# Patient Record
Sex: Female | Born: 1960 | Race: Black or African American | Hispanic: No | Marital: Married | Smoking: Never smoker
Health system: Southern US, Community
[De-identification: ages and names within clinical notes are randomized; demographics above are authoritative.]

## PROBLEM LIST (undated history)

## (undated) DIAGNOSIS — M1990 Unspecified osteoarthritis, unspecified site: Secondary | ICD-10-CM

## (undated) DIAGNOSIS — J301 Allergic rhinitis due to pollen: Secondary | ICD-10-CM

## (undated) DIAGNOSIS — I10 Essential (primary) hypertension: Secondary | ICD-10-CM

## (undated) HISTORY — PX: FOOT SURGERY: SHX648

## (undated) HISTORY — PX: UTERINE FIBROID SURGERY: SHX826

## (undated) HISTORY — PX: OTHER SURGICAL HISTORY: SHX169

## (undated) HISTORY — DX: Essential (primary) hypertension: I10

## (undated) HISTORY — PX: POLYPECTOMY: SHX149

## (undated) HISTORY — DX: Allergic rhinitis due to pollen: J30.1

---

## 1999-06-22 ENCOUNTER — Other Ambulatory Visit: Admission: RE | Admit: 1999-06-22 | Discharge: 1999-06-22 | Payer: Self-pay | Admitting: *Deleted

## 2000-09-18 ENCOUNTER — Other Ambulatory Visit: Admission: RE | Admit: 2000-09-18 | Discharge: 2000-09-18 | Payer: Self-pay | Admitting: *Deleted

## 2001-05-21 ENCOUNTER — Other Ambulatory Visit: Admission: RE | Admit: 2001-05-21 | Discharge: 2001-05-21 | Payer: Self-pay | Admitting: *Deleted

## 2002-05-23 ENCOUNTER — Emergency Department (HOSPITAL_COMMUNITY): Admission: EM | Admit: 2002-05-23 | Discharge: 2002-05-23 | Payer: Self-pay | Admitting: Emergency Medicine

## 2002-05-24 ENCOUNTER — Ambulatory Visit: Admission: RE | Admit: 2002-05-24 | Discharge: 2002-05-24 | Payer: Self-pay | Admitting: Emergency Medicine

## 2002-05-24 ENCOUNTER — Encounter: Payer: Self-pay | Admitting: Emergency Medicine

## 2002-06-23 ENCOUNTER — Observation Stay (HOSPITAL_COMMUNITY): Admission: RE | Admit: 2002-06-23 | Discharge: 2002-06-24 | Payer: Self-pay | Admitting: Neurosurgery

## 2003-12-08 ENCOUNTER — Encounter: Admission: RE | Admit: 2003-12-08 | Discharge: 2003-12-08 | Payer: Self-pay | Admitting: Sports Medicine

## 2003-12-08 ENCOUNTER — Encounter (INDEPENDENT_AMBULATORY_CARE_PROVIDER_SITE_OTHER): Payer: Self-pay | Admitting: Sports Medicine

## 2004-03-23 ENCOUNTER — Encounter: Admission: RE | Admit: 2004-03-23 | Discharge: 2004-03-23 | Payer: Self-pay | Admitting: Family Medicine

## 2004-07-19 ENCOUNTER — Encounter: Admission: RE | Admit: 2004-07-19 | Discharge: 2004-07-19 | Payer: Self-pay | Admitting: Family Medicine

## 2004-09-21 ENCOUNTER — Ambulatory Visit: Payer: Self-pay | Admitting: Family Medicine

## 2004-09-23 ENCOUNTER — Encounter: Admission: RE | Admit: 2004-09-23 | Discharge: 2004-09-23 | Payer: Self-pay | Admitting: Sports Medicine

## 2005-07-18 ENCOUNTER — Ambulatory Visit: Payer: Self-pay | Admitting: Family Medicine

## 2005-07-31 ENCOUNTER — Encounter: Admission: RE | Admit: 2005-07-31 | Discharge: 2005-07-31 | Payer: Self-pay | Admitting: Sports Medicine

## 2005-08-04 ENCOUNTER — Encounter: Admission: RE | Admit: 2005-08-04 | Discharge: 2005-08-04 | Payer: Self-pay | Admitting: Sports Medicine

## 2006-09-27 ENCOUNTER — Ambulatory Visit: Payer: Self-pay | Admitting: Family Medicine

## 2006-11-17 ENCOUNTER — Encounter (INDEPENDENT_AMBULATORY_CARE_PROVIDER_SITE_OTHER): Payer: Self-pay | Admitting: *Deleted

## 2006-11-17 LAB — CONVERTED CEMR LAB

## 2006-11-19 ENCOUNTER — Encounter (INDEPENDENT_AMBULATORY_CARE_PROVIDER_SITE_OTHER): Payer: Self-pay | Admitting: Sports Medicine

## 2006-11-19 LAB — CONVERTED CEMR LAB: Pap Smear: NORMAL

## 2006-11-20 ENCOUNTER — Ambulatory Visit: Payer: Self-pay | Admitting: Sports Medicine

## 2007-02-15 ENCOUNTER — Encounter (INDEPENDENT_AMBULATORY_CARE_PROVIDER_SITE_OTHER): Payer: Self-pay | Admitting: Sports Medicine

## 2007-02-15 ENCOUNTER — Encounter (INDEPENDENT_AMBULATORY_CARE_PROVIDER_SITE_OTHER): Payer: Self-pay | Admitting: *Deleted

## 2007-02-26 ENCOUNTER — Ambulatory Visit (HOSPITAL_COMMUNITY): Admission: RE | Admit: 2007-02-26 | Discharge: 2007-02-26 | Payer: Self-pay | Admitting: Gynecology

## 2007-02-28 ENCOUNTER — Encounter (INDEPENDENT_AMBULATORY_CARE_PROVIDER_SITE_OTHER): Payer: Self-pay | Admitting: Sports Medicine

## 2007-02-28 DIAGNOSIS — D259 Leiomyoma of uterus, unspecified: Secondary | ICD-10-CM | POA: Insufficient documentation

## 2007-03-15 ENCOUNTER — Telehealth (INDEPENDENT_AMBULATORY_CARE_PROVIDER_SITE_OTHER): Payer: Self-pay | Admitting: *Deleted

## 2007-05-28 ENCOUNTER — Encounter: Admission: RE | Admit: 2007-05-28 | Discharge: 2007-05-28 | Payer: Self-pay | Admitting: Sports Medicine

## 2007-05-28 ENCOUNTER — Encounter (INDEPENDENT_AMBULATORY_CARE_PROVIDER_SITE_OTHER): Payer: Self-pay | Admitting: Sports Medicine

## 2007-05-31 ENCOUNTER — Encounter (INDEPENDENT_AMBULATORY_CARE_PROVIDER_SITE_OTHER): Payer: Self-pay | Admitting: Sports Medicine

## 2007-12-07 IMAGING — RF DG HYSTEROGRAM
5 series · 5 of 5 positions shown · non-contrast
Comparison: none

CLINICAL DATA: HYSTEROSALPINGOGRAM:
The procedure was performed by Dr. Lui.

[Series 1: run · 1 of 1 slices shown (1 of 5)]
[im 1/1]
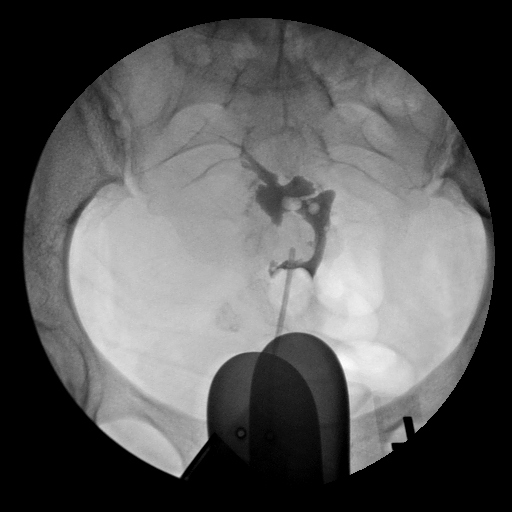

[Series 2: run · 1 of 1 slices shown (2 of 5)]
[im 1/1]
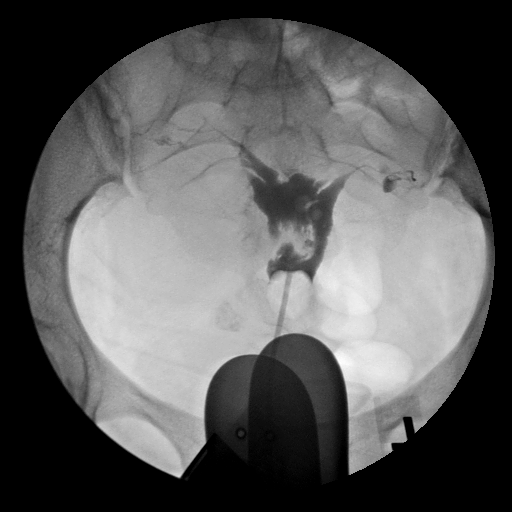

[Series 3: run · 1 of 1 slices shown (3 of 5)]
[im 1/1]
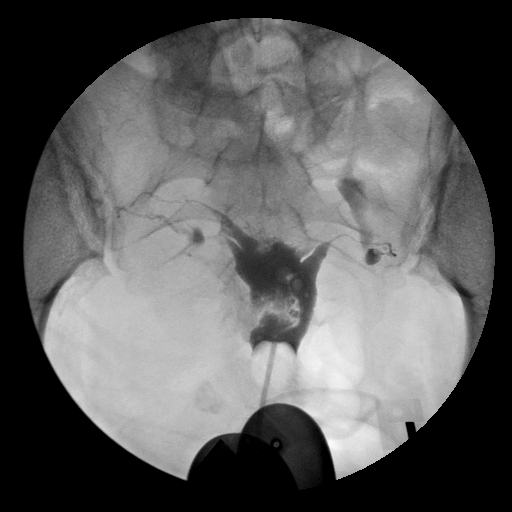

[Series 4: run · 1 of 1 slices shown (4 of 5)]
[im 1/1]
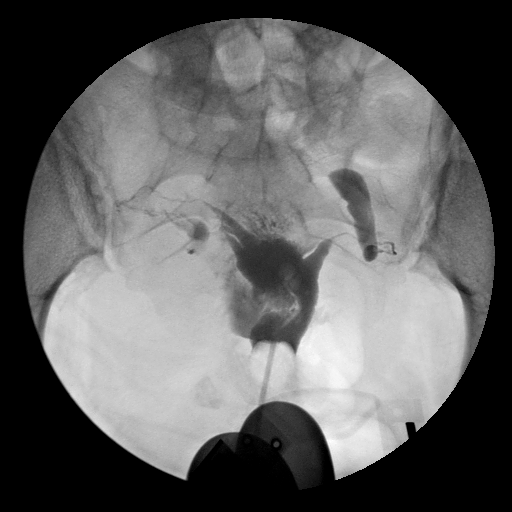

[Series 5: run · 1 of 1 slices shown (5 of 5)]
[im 1/1]
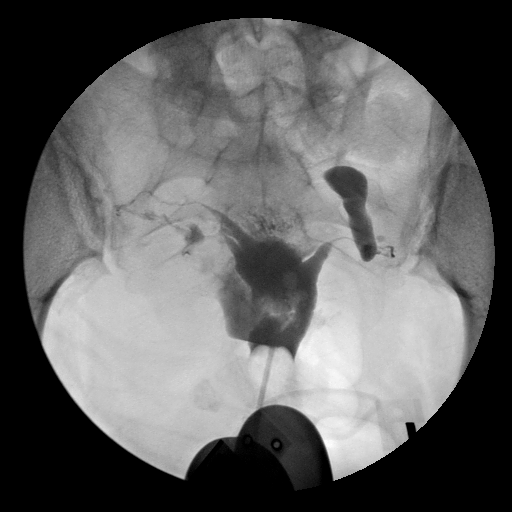

[5 of 5 positions shown; findings below may reference images not displayed]

FINDINGS: Five images are submitted for interpretation.  No free spill of contrast is seen into either adnexal region.  There is dilatation of the distal aspect of both fallopian tubes, left much more so that right, consistent with hydrosalpinx.  There is a round filling defect in the central uterine cavity originating from the right of midline, most consistent with a fibroid.  There are also linear filling defects at the fundus, likely related to synechia.
IMPRESSION: 1.  Blocked tubes  with hydrosalpinx bilaterally, left larger than  right.  
2.  Filling defect in the mid uterine cavity originating from the right uterine wall most consistent with a fibroid.  
3.  Fundal synechia.

## 2007-12-19 LAB — CONVERTED CEMR LAB: Pap Smear: NORMAL

## 2008-02-02 ENCOUNTER — Emergency Department: Payer: Self-pay | Admitting: Emergency Medicine

## 2008-04-15 ENCOUNTER — Ambulatory Visit: Payer: Self-pay | Admitting: Family Medicine

## 2008-04-15 DIAGNOSIS — R51 Headache: Secondary | ICD-10-CM | POA: Insufficient documentation

## 2008-04-15 DIAGNOSIS — M25569 Pain in unspecified knee: Secondary | ICD-10-CM | POA: Insufficient documentation

## 2008-04-15 DIAGNOSIS — R519 Headache, unspecified: Secondary | ICD-10-CM | POA: Insufficient documentation

## 2008-04-15 LAB — CONVERTED CEMR LAB
BUN: 11 mg/dL (ref 6–23)
CO2: 24 meq/L (ref 19–32)
Calcium: 9.3 mg/dL (ref 8.4–10.5)
Chloride: 105 meq/L (ref 96–112)
Cholesterol: 186 mg/dL (ref 0–200)
Creatinine, Ser: 0.5 mg/dL (ref 0.40–1.20)
Glucose, Bld: 100 mg/dL — ABNORMAL HIGH (ref 70–99)
HDL: 51 mg/dL (ref >39)
LDL Cholesterol: 119 mg/dL — ABNORMAL HIGH (ref 0–99)
Potassium: 4.4 meq/L (ref 3.5–5.3)
Sodium: 140 meq/L (ref 135–145)
Total CHOL/HDL Ratio: 3.6
Triglycerides: 82 mg/dL (ref <150)
VLDL: 16 mg/dL (ref 0–40)

## 2008-04-17 ENCOUNTER — Encounter: Payer: Self-pay | Admitting: Family Medicine

## 2008-06-22 ENCOUNTER — Telehealth: Payer: Self-pay | Admitting: *Deleted

## 2008-06-22 ENCOUNTER — Ambulatory Visit: Payer: Self-pay | Admitting: Family Medicine

## 2008-07-23 ENCOUNTER — Encounter: Admission: RE | Admit: 2008-07-23 | Discharge: 2008-07-23 | Payer: Self-pay | Admitting: Obstetrics and Gynecology

## 2008-10-13 ENCOUNTER — Telehealth: Payer: Self-pay | Admitting: *Deleted

## 2009-07-26 ENCOUNTER — Encounter: Admission: RE | Admit: 2009-07-26 | Discharge: 2009-07-26 | Payer: Self-pay | Admitting: Obstetrics and Gynecology

## 2009-08-04 ENCOUNTER — Encounter: Admission: RE | Admit: 2009-08-04 | Discharge: 2009-08-04 | Payer: Self-pay | Admitting: Obstetrics and Gynecology

## 2010-06-29 ENCOUNTER — Ambulatory Visit: Payer: Self-pay | Admitting: Family Medicine

## 2010-06-29 DIAGNOSIS — J309 Allergic rhinitis, unspecified: Secondary | ICD-10-CM | POA: Insufficient documentation

## 2010-07-03 ENCOUNTER — Emergency Department (HOSPITAL_COMMUNITY): Admission: EM | Admit: 2010-07-03 | Discharge: 2010-07-03 | Payer: Self-pay | Admitting: Family Medicine

## 2010-07-13 ENCOUNTER — Ambulatory Visit: Payer: Self-pay | Admitting: Family Medicine

## 2010-07-13 DIAGNOSIS — B079 Viral wart, unspecified: Secondary | ICD-10-CM | POA: Insufficient documentation

## 2010-07-27 ENCOUNTER — Encounter: Admission: RE | Admit: 2010-07-27 | Discharge: 2010-07-27 | Payer: Self-pay | Admitting: Obstetrics and Gynecology

## 2010-08-17 ENCOUNTER — Ambulatory Visit: Payer: Self-pay | Admitting: Family Medicine

## 2011-01-04 LAB — CBC
HCT: 37.8 % (ref 36.0–46.0)
Hemoglobin: 13.4 g/dL (ref 12.0–15.0)
MCH: 31.4 pg (ref 26.0–34.0)
MCHC: 35.4 g/dL (ref 30.0–36.0)
MCV: 88.5 fL (ref 78.0–100.0)
Platelets: 198 10*3/uL (ref 150–400)
RBC: 4.27 MIL/uL (ref 3.87–5.11)
RDW: 13.1 % (ref 11.5–15.5)
WBC: 5.8 10*3/uL (ref 4.0–10.5)

## 2011-01-04 LAB — SURGICAL PCR SCREEN
MRSA, PCR: NEGATIVE
Staphylococcus aureus: NEGATIVE

## 2011-01-10 ENCOUNTER — Inpatient Hospital Stay (HOSPITAL_COMMUNITY)
Admission: RE | Admit: 2011-01-10 | Discharge: 2011-01-12 | Payer: Self-pay | Source: Home / Self Care | Attending: Obstetrics and Gynecology | Admitting: Obstetrics and Gynecology

## 2011-01-10 ENCOUNTER — Encounter (INDEPENDENT_AMBULATORY_CARE_PROVIDER_SITE_OTHER): Payer: Self-pay | Admitting: Obstetrics and Gynecology

## 2011-01-11 LAB — CBC
HCT: 26.6 % — ABNORMAL LOW (ref 36.0–46.0)
Hemoglobin: 9.2 g/dL — ABNORMAL LOW (ref 12.0–15.0)
MCH: 31.7 pg (ref 26.0–34.0)
MCHC: 34.6 g/dL (ref 30.0–36.0)
MCV: 91.7 fL (ref 78.0–100.0)
Platelets: 107 10*3/uL — ABNORMAL LOW (ref 150–400)
RBC: 2.9 MIL/uL — ABNORMAL LOW (ref 3.87–5.11)
RDW: 13.3 % (ref 11.5–15.5)
WBC: 8.2 10*3/uL (ref 4.0–10.5)

## 2011-01-11 LAB — PREGNANCY, URINE: Preg Test, Ur: NEGATIVE

## 2011-01-12 LAB — CBC
HCT: 23.9 % — ABNORMAL LOW (ref 36.0–46.0)
Hemoglobin: 8.2 g/dL — ABNORMAL LOW (ref 12.0–15.0)
MCH: 31.3 pg (ref 26.0–34.0)
MCHC: 34.3 g/dL (ref 30.0–36.0)
MCV: 91.2 fL (ref 78.0–100.0)
Platelets: 108 10*3/uL — ABNORMAL LOW (ref 150–400)
RBC: 2.62 MIL/uL — ABNORMAL LOW (ref 3.87–5.11)
RDW: 13.3 % (ref 11.5–15.5)
WBC: 8 10*3/uL (ref 4.0–10.5)

## 2011-01-17 NOTE — Assessment & Plan Note (Signed)
Summary: freeze wart on leg,tcb   Vital Signs:  Patient profile:   50 year old female Height:      67.5 inches Weight:      203 pounds BMI:     31.44 BSA:     2.05 Temp:     98.1 degrees F Pulse rate:   76 / minute BP sitting:   160 / 94  Vitals Entered By: Jone Baseman CMA (July 13, 2010 4:36 PM) CC: freeze wart on legs Is Patient Diabetic? No Pain Assessment Patient in pain? no        CC:  freeze wart on legs.  History of Present Illness: Pt here for re-freeze of wart on right leg.  Seen about 2 weeks ago and had wart froozen on her right leg for the first time.  reports some scabbing over of the wart, but feels that it is generally improved from last visit.  No abnormal reactions, tolerated last procedure well. Would like repeat freezing today.   Habits & Providers  Alcohol-Tobacco-Diet     Tobacco Status: never  Problems Prior to Update: 1)  Allergic Rhinitis  (ICD-477.9) 2)  Elevated Blood Pressure Without Diagnosis of Hypertension  (ICD-796.2) 3)  Patello-femoral Syndrome  (ICD-719.46) 4)  Headache  (ICD-784.0) 5)  Well Adult Exam  (ICD-V70.0) 6)  Fibroids, Uterus  (ICD-218.9)  Allergies (verified): No Known Drug Allergies  Physical Exam  General:  VS reviewed, elevated BPalert, well-developed, well-nourished, and well-hydrated.   Msk:  normal ROM, no joint tenderness, and no joint swelling.   Extremities:  No clubbing, cyanosis, edema Skin:  right leg: below knee scabbed wart about the size of a quarter noted   Impression & Recommendations:  Problem # 1:  WART, RIGHT KNEE (ICD-078.10) Assessment Improved  Re-frooze today.  Pt tolerated well, may need 1 more treatment.  F/u in 3-4 weeks for another tx and BP check per Dr. Jennette Kettle    Orders: [facility]- Est Level  3 (81829)  Complete Medication List: 1)  Fluticasone Propionate 50 Mcg/act Susp (Fluticasone propionate) .Marland Kitchen.. 1-2 puffs each nostril once daily  Patient Instructions: 1)  Nice to meet  you! 2)  Please continue taking your blood pressure as you and Dr. Jennette Kettle discussed at your last visit.  Write them down and RTC in 3-4 weeks.  At that visit Dr. Jennette Kettle will check your leg and see if you need any further treatment.  She will also discuss your blood pressure with you.

## 2011-01-17 NOTE — Assessment & Plan Note (Signed)
Summary: CPE,tcb   Vital Signs:  Patient profile:   50 year old female Height:      67.5 inches Weight:      205.0 pounds BMI:     31.75 Temp:     98.7 degrees F oral Pulse rate:   76 / minute BP sitting:   152 / 90  (left arm) Cuff size:   large  Vitals Entered By: Gladstone Pih (June 29, 2010 10:50 AM) CC: CPE Is Patient Diabetic? No Pain Assessment Patient in pain? no      Comments pap done at GYN office   CC:  CPE.  History of Present Illness: CPE has had some cold type symptoms and allergy symptoms last few weeks. No fever. Some sore throat that seems to be intermittent Mild nasal stuffiness. No fatigue fever sweats or chills  2) right upper back pain taht is mild but aggravating--notices after extended sitting working on computer. self resolves. 2/10. no radaiation.  3) Bump on back she is concerned with  4) GYN takes care of her pap smear and breast exam--has appt in next few months. Not sure when her mammo is due--thinks she  Habits & Providers  Alcohol-Tobacco-Diet     Tobacco Status: never  Problems Prior to Update: 1)  Patello-femoral Syndrome  (ICD-719.46) 2)  Headache  (ICD-784.0) 3)  Well Adult Exam  (ICD-V70.0) 4)  Fibroids, Uterus  (ICD-218.9)  Current Medications (verified): 1)  Fluticasone Propionate 50 Mcg/act Susp (Fluticasone Propionate) .Marland Kitchen.. 1-2 Puffs Each Nostril Once Daily  Review of Systems  The patient denies anorexia, fever, weight loss, weight gain, chest pain, syncope, dyspnea on exertion, peripheral edema, prolonged cough, headaches, abdominal pain, severe indigestion/heartburn, difficulty walking, depression, and abnormal bleeding.    Physical Exam  General:  alert, well-developed, well-nourished, and well-hydrated.   Eyes:  pupils equal, pupils round, and pupils reactive to light.   Ears:  R ear normal and L ear normal.   Nose:  no external deformity.   nasal mucosa is boggy and swollen, erythematous, no d/c Mouth:  good  dentition.   Neck:  supple, full ROM, no masses, and no thyromegaly.   Breasts:  deferred to GYN Lungs:  normal respiratory effort and normal breath sounds.   Heart:  normal rate, regular rhythm, and no murmur.   Abdomen:  soft, non-tender, normal bowel sounds, no distention, and no masses.   Genitalia:  deferred to GYN Msk:  normal ROM, no joint tenderness, no joint swelling, and no joint warmth.   Mild TTP right rhombioid muscle area Extremities:  No clubbing, cyanosis, edema, or deformity noted with normal full range of motion of all joints.   Neurologic:  alert & oriented X3, gait normal, and DTRs symmetrical and normal.   Skin:  no worrisome skin lesions on complete skin exam. Lots of vevi on back.One area of blocked pore on back with no sign of ifection Cervical Nodes:  No lymphadenopathy noted Axillary Nodes:  No palpable lymphadenopathy Psych:  Oriented X3, memory intact for recent and remote, normally interactive, good eye contact, not anxious appearing, and not depressed appearing.     Impression & Recommendations:  Problem # 1:  WELL ADULT EXAM (ICD-V70.0)  Orders: [facility] - Est  40-64 yrs (16109) she will check on her mammogram exercising discussed calcium intake  Problem # 2:  ELEVATED BLOOD PRESSURE WITHOUT DIAGNOSIS OF HYPERTENSION (ICD-796.2) could be related to some otc cold medicine she is taking gave her BP card to fill out  and return with some random readings  Problem # 3:  ALLERGIC RHINITIS (ICD-477.9)  Her updated medication list for this problem includes:    Fluticasone Propionate 50 Mcg/act Susp (Fluticasone propionate) .Marland Kitchen... 1-2 puffs each nostril once daily will restart her on nasal steroids--has used before  Complete Medication List: 1)  Fluticasone Propionate 50 Mcg/act Susp (Fluticasone propionate) .Marland Kitchen.. 1-2 puffs each nostril once daily  Patient Instructions: 1)  Please check on your mammogram and see if it is due. 2)  Please record a few blood  pressures and mail the card back to me. 3)  Come back in 2-3 weeks and we can do a second freeze of the wart on your leg. 4)  Great to see you! Prescriptions: FLUTICASONE PROPIONATE 50 MCG/ACT SUSP (FLUTICASONE PROPIONATE) 1-2 puffs each nostril once daily  #1 x 12   Entered and Authorized by:   Denny Levy MD   Signed by:   Denny Levy MD on 06/29/2010   Method used:   Print then Give to Patient   RxID:   [id number]

## 2011-01-17 NOTE — Assessment & Plan Note (Signed)
Summary: f/u last visit/eo   Vital Signs:  Patient profile:   50 year old female Weight:      203.8 pounds Temp:     98.7 degrees F oral Pulse rate:   71 / minute Pulse rhythm:   regular BP sitting:   153 / 88  (left arm) Cuff size:   large  Vitals Entered By: Loralee Pacas CMA (August 17, 2010 2:02 PM)  History of Present Illness: 1) f/u elevated BP Readings at home high 140s with occasional 15, bottom in 88 range. No symptoms.  2)Has been very stressed--her mother has some dementia--is living by herself---Chloeanne very involved inher care andstruggling with decisions. Fortunately lives 9 minutes away from her [place]. Mom diabetic, wheel chair bound, has dog for companion. 3) warty area onknee improved--still remaining wart. No problems with last freezing.  Current Medications (verified): 1)  Fluticasone Propionate 50 Mcg/act Susp (Fluticasone Propionate) .Marland Kitchen.. 1-2 Puffs Each Nostril Once Daily 2)  Triamterene-Hctz 37.5-25 Mg Tabs (Triamterene-Hctz) .Marland Kitchen.. 1 By Mouth Qd  Allergies (verified): No Known Drug Allergies  Review of Systems  The patient denies anorexia, fever, weight loss, chest pain, syncope, dyspnea on exertion, and peripheral edema.    Physical Exam  General:  alert, well-developed, well-nourished, and well-hydrated.   Neck:  supple and full ROM.   Lungs:  normal respiratory effort.   Heart:  normal rate, regular rhythm, and no murmur.   Skin:  right knee still has warty area but 50% reduction in wart mass from initial. Actually 5 separate small warts now. some mild redness where scab from last freeze has comeoff--no sign onfection Psych:  Oriented X3, memory intact for recent and remote, normally interactive, good eye contact, not anxious appearing, and not depressed appearing.     Impression & Recommendations:  Problem # 1:  HYPERTENSION (ICD-401.9)  multiple readings in 145-155 range with diastolics in 88 range. Will start dyazide. tale some interval readings  and rtc 1 m Orders: [facility]- Est  Level 4 (99214) check bmp next ov  Problem # 2:  WART, RIGHT KNEE (ICD-078.10)  we will re-freeze today andprobably (hopefully ) final treatment next ov 1 m Orders: [facility]- Est  Level 4 (99214) Cryo (2nd - 14th) - [facility] (17003)  Problem # 3:  FAMILY STRESS (ICD-V61.9)  long discussion--spending 50% 40 minute ov in counseling and education re issues of dealing with older parent. Support Orders: [facility]- Est  Level 4 (47829)  Complete Medication List: 1)  Fluticasone Propionate 50 Mcg/act Susp (Fluticasone propionate) .Marland Kitchen.. 1-2 puffs each nostril once daily 2)  Triamterene-hctz 37.5-25 Mg Tabs (Triamterene-hctz) .Marland Kitchen.. 1 by mouth qd [facility]- Est  Level 4 (99214) Cryo (2nd - 14th) - [facility] (56213) Prescriptions: TRIAMTERENE-HCTZ 37.5-25 MG TABS (TRIAMTERENE-HCTZ) 1 by mouth qd  #30 x 12   Entered and Authorized by:   Denny Levy MD   Signed by:   Denny Levy MD on 08/17/2010   Method used:   Electronically to        [facility]  [address] (retail)       [address]       [place], [place]  [address]       Ph: [phone] or [phone]       Fax: [phone]   RxID:   [id number]    Impression & Recommendations:  Her updated medication list for this problem includes:    Triamterene-hctz 37.5-25 Mg Tabs (Triamterene-hctz) .Marland Kitchen... 1 by mouth qd    Orders: [facility]- Est  Level 4 (99214)       Orders: [facility]- Est  Level 4 (99214) Cryo (2nd - 14th) - [facility] (17003)       Orders: [facility]- Est  Level 4 (04540)     Complete Medication List: 1)  Fluticasone Propionate 50 Mcg/act Susp (Fluticasone propionate) .Marland Kitchen.. 1-2 puffs each nostril once daily 2)  Triamterene-hctz 37.5-25 Mg Tabs (Triamterene-hctz) .Marland Kitchen.. 1 by mouth qd   Prevention & Chronic Care Immunizations   Influenza vaccine: Not documented    Tetanus booster: 04/15/2008: given   Tetanus booster due: 04/15/2018    Pneumococcal vaccine: Not documented  Other Screening   Pap smear: normal   (12/19/2007)   Pap smear due: 12/18/2008    Mammogram: ASSESSMENT: Negative - BI-RADS 1^MM DIGITAL SCREENING  (07/27/2010)   Mammogram due: 05/2008   Smoking status: never  (07/13/2010)  Lipids   Total Cholesterol: 186  (04/15/2008)   LDL: 119  (04/15/2008)   LDL Direct: Not documented   HDL: 51  (04/15/2008)   Triglycerides: 82  (04/15/2008)  Hypertension   Last Blood Pressure: 153 / 88  (08/17/2010)   Serum creatinine: 0.50  (04/15/2008)   Serum potassium 4.4  (04/15/2008)    Hypertension flowsheet reviewed?: Yes   Progress toward BP goal: Unchanged  Self-Management Support :   Personal Goals (by the next clinic visit) :      Personal blood pressure goal: 140/90  (08/17/2010)   Hypertension self-management support: Not documented

## 2011-01-25 NOTE — Discharge Summary (Signed)
  Courtney Sheppard, Courtney Sheppard                ACCOUNT NO.:  [id number]  MEDICAL RECORD NO.:  [id number]          PATIENT TYPE:  INP  LOCATION:  9320                          [facility]ILITY:  [facility]  PHYSICIAN:  Maxie Better, M.D.DATE OF BIRTH:  11-05-1961  DATE OF ADMISSION:  01/10/2011 DATE OF DISCHARGE:  01/12/2011                              DISCHARGE SUMMARY   ADMISSION DIAGNOSES: 1. Fibroid uterus/enlarging uterine fibroids. 2. Menorrhagia.  DISCHARGE DIAGNOSES: 1. Fibroid uterus. 2. Bilateral hydrosalpinx. 3. Pelvic adhesions. 4. Anemia secondary to acute blood loss.  PROCEDURES: 1. Exploratory laparotomy. 2. Myomectomy. 3. Distal bilateral tuboplasty. 4. Lysis of adhesions.  HISTORY OF PRESENT ILLNESS:  A 50 year old G0, married black female with long history of infertility, fibroid uterus which is increasing in size with increased cycles, who is now being admitted for surgical management.  HOSPITAL COURSE:  The patient was admitted to [facility].  She was taken to the operating room where she underwent a removal of 415 g fibroid by pathology report.  The findings at the time of surgery is that of bilateral hydrosalpinges and some pelvic  adhesions of the right fallopian tube and ovary to the uterus posteriorly.  These adhesions were lysed and the distal end of both tubes were opened and tuboplasty performed along with Myomectomy.  Please see the dictated operative report for specific details.  Her postoperative course was notable for a slow bowel function which was subsequently resolved by postop day #2 with a passage of flatus.  Her CBC on postop day #1 showed a hemoglobin of 9.28, white count 8.2, hematocrit 26.6, and platelet count of 107,000 which will be repeated before the patient is discharged.  The patient remained afebrile throughout hospital course.  Her incision was notable for ecchymosis on the incision, particularly on the right side, extending slightly  about an inch above that incision and on the mons pubis.  Probing of the incision did not reveal any  fluid.  The patient had no pad.  Abdomen had active bowel sounds.  She had no evidence of infection.  She was deemed well to be discharged home.  DISPOSITION:  Home.  CONDITION:  Stable.  DISCHARGE MEDICATIONS: 1. Tylox 1-2 tablets every 3-4 hours p.r.n. pain. 2. Motrin 800 mg every 6-8 hours p.r.n. pain. 3. Niferex Forte 1 p.o. daily.  FOLLOWUP APPOINTMENT:  In [facility] for staple removal on January 17, 2011, and 6 weeks postop.  DISCHARGE INSTRUCTIONS:  Reviewed with the patient including but not limited to nothing per vagina for 4-6 weeks, no heavy lifting or driving for 2 weeks.  Call for increased incisional pain, drainage or redness in the incision site, severe abdominal pain, nausea, vomiting, no intercourse, or tampons.     Maxie Better, M.D.     [place]/MEDQ  D:  01/12/2011  T:  01/13/2011  Job:  [id number]  Electronically Signed by Nena Jordan Dwon Sky M.D. on 01/25/2011 07:07:47 AM

## 2011-01-25 NOTE — Op Note (Signed)
[name], Courtney Sheppard                ACCOUNT NO.:  [id number]  MEDICAL RECORD NO.:  [id number]          PATIENT TYPE:  INP  LOCATION:  9320                          [facility]ACILITY:  [facility]  PHYSICIAN:  Maxie Better, M.D.DATE OF BIRTH:  November 25, 1961  DATE OF PROCEDURE:  01/10/2011 DATE OF DISCHARGE:                              OPERATIVE REPORT   PREOPERATIVE DIAGNOSES: 1. Enlarging uterine fibroids. 2. Infertility. 3. Menorrhagia.  PROCEDURES:  Exploratory laparotomy, myomectomy, distal bilateral tuboplasty, lysis of adhesions.  POSTOPERATIVE DIAGNOSES: 1. Enlarging uterine fibroids. 2. Menorrhagia. 3. Bilateral hydrosalpinx. 4. Pelvic adhesions.  ANESTHESIA:  General.  SURGEON:  Maxie Better, MD  ASSISTANT:  Genia Del, MD  PROCEDURE:  Under adequate general anesthesia, the patient was placed in the dorsal lithotomy position.  Examination under anesthesia revealed a uterus at about 18-week size, mobile.  The patient was sterilely prepped and draped in usual fashion.  Indwelling Foley catheter was sterilely placed.  Bivalve speculum was placed in the vagina and acorn cannula was introduced into the cervical os and attached to the tenaculum for manipulation of the uterus.  The cervix was then grasped with a single- tooth tenaculum and attached to the acorn cannula methylene blue. Chromopertubation apparatus was then attached to the acorn cannula. Attention was then turned to the abdomen.  A 0.25% Marcaine was injected along the planned Pfannenstiel skin incision site.  Pfannenstiel skin incision was then made carried down to the rectus fascia.  Rectus fascia was opened transversely.  The rectus fascia was then bluntly and sharply dissected off the rectus muscle in the superior and inferior fashion. The rectus muscles split in midline.  The parietal peritoneum was entered sharply and extended.  Upper abdomen was noted to have normal palpable liver and palpable  kidneys.  The uterus was enlarged, mobile with evidence of the left tube and  its distal 2/3 dilated  with dark fluid with absence of any evidence of fimbriae, consistent with left hydrosalpinx, similar finding was noted on the right tube except the fluid was clear on the right and the tube was attached to uterus and the right ovary.  The left ovary was normal.   The right ovary had some adhesions to the lateral aspect of the uterus.   The posterior cul-de-sac had small adhesion which was able to be removed.  An attempt to exteriorize the uterus on several occasions was unsuccessful due to the location of the fibroids.  Decision was then made to place the self- retaining Balfour retractor.  The palpation of fibroid anteriorly resulted in decision to place dilute solution of Pitressin in a vertical fashion followed by incision overlying that fibroid and the incision was carried down to the level of the fibroid capsule.  At that point, the fibroid was grasped with single-tooth tenaculum with traction resulting in the deliver that fibroid.  The uterus was then exteriorized, resulting in evidence of the large 12 cm fibroid that was noted. It had been described as posterior in location on sonogram.  At that point, more dilute  solution of Pitressin was injected and incision on the uterus was extended anteriorly and  the fibroid was enucleated with sharp and blunt dissection from its base.  At that point, the chromopertubation was performed.  No evidence of blue dye was noted in either of the tubes or the cavity  despite about 240 mL of fluid insufflated. Palpation throughout the uterus did not reveal any other fibroids. The uterus still appear to be enlarged which may suggest adenomyosis.  Once there was no evidence of any other additional fibroids, the defect from where the fibroid came was closed with interrupted 0 Vicryl figure-of- eight sutures all the way to the serosal surface at which time  3-0 Monocryl suture was then utilized to close the serosa in its baseball fashion.  Good hemostasis noted at that point.  Attention was then turned to tubes and ovary.  On the right, the ovary was lysed off of this attachment to the uterus posteriorly.  The left hydrosalpinx was brought up and was opened like a plus sign fashion using cautery and then using a 5-0 Vicryl, the tips of the stellate incision was then everted circumferentially and leaving the tube open. Nonmalodorous fluid was released. The same procedure was performed on the right tube with again opening of the distal end of the fallopian tube in a plus sign/stellate fashion and followed by using 5-0 Vicryl and attaching each flap circumferentially around to keep the tube open. Good hemostasis was then noted.  The abdomen was then copiously irrigated and suctioned.  The self-retaining retractor was removed.  The uterus was then returned to the abdomen.  Good hemostasis was noted along the uterine incision and Interceed was then placed overlying that incision.  The parietal peritoneum was then closed with 2-0 Vicryl.  The rectus fascia was closed with 0 Vicryl x2.  The subcutaneous area was irrigated, small bleeders cauterized, interrupted 2-0 plain sutures placed and the skin approximated with Ethicon staples.  The instruments from the vagina was also removed.  Specimen was a large fibroid weighing 420g sent to Pathology.  Estimated blood loss was 250 mL.  Intraoperative fluid 1100 mL.  Urine output 400 mL clear yellow urine.  Sponge and instrument counts x2 was correct.  Complications was none.  The patient tolerated the procedure well, was transferred to recovery in stable condition.     Maxie Better, M.D.     [place]/MEDQ  D:  01/10/2011  T:  01/11/2011  Job:  [id number]  Electronically Signed by Nena Jordan Ayvin Lipinski M.D. on 01/25/2011 07:48:45 AM

## 2011-01-26 ENCOUNTER — Encounter: Payer: Self-pay | Admitting: *Deleted

## 2011-05-05 NOTE — Op Note (Signed)
. [facility]  Patient:    Courtney Sheppard, Courtney Sheppard Visit Number: [id number] MRN: [id number]          Service Type: SUR Location: [id number] Attending Physician:  Cristi Loron Dictated by:   Cristi Loron, M.D. Proc. Date: 06/23/02 Admit Date:  06/23/2002 Discharge Date: 06/24/2002                             Operative Report  PREOPERATIVE DIAGNOSES:  Left C6-7 herniated nucleus pulposus, spinal stenosis, cervical radiculopathy, cervicalgia.  POSTOPERATIVE DIAGNOSES:  Left C6-7 herniated nucleus pulposus, spinal stenosis, cervical radiculopathy, cervicalgia.  PROCEDURE:  C6-7 extensive anterior cervical diskectomy, interbody iliac crest allograft arthrodesis, anterior cervical plating (Synthes titanium plate and screws).  SURGEON:  Cristi Loron, M.D.  ASSISTANT:  Tanya Nones. Jeral Fruit, M.D.  ANESTHESIA:  General endotracheal.  ESTIMATED BLOOD LOSS:  150 cc.  SPECIMENS:  None.  DRAINS:  None.  COMPLICATIONS:  None.  BRIEF HISTORY:  The patient is a 50 year old black female who has suffered from neck and left arm pain.  She failed medical management and was worked up with a cervical MRI, which demonstrated a herniated disk at C6-7 on the left. The patients signs, symptoms, and physical exam were consistent with a C7 radiculopathy.  I discussed the various treatment options with her, including surgery.  The patient weighed the risks, benefits, and alternatives of surgery and decided to proceed with the operation.  DESCRIPTION OF PROCEDURE:  The patient was brought to the operating room by the anesthesia team.  General endotracheal anesthesia was induced.  The patient remained in the supine position.  A roll was placed under her shoulders to place her neck in slight extension.  Her anterior cervical region was then prepared with Betadine scrub and Betadine solution.  Sterile drapes were applied.  I then injected the area to be  incised with Marcaine with epinephrine solution and used a scalpel to make a transverse incision in the patients left anterior neck.  I used the Metzenbaum scissors to divide the platysma muscle and then to dissect medial to the sternocleidomastoid muscle, jugular vein, and carotid artery.  I bluntly dissected down toward the anterior cervical spine and carefully identified the esophagus and retracted it medially.  I cleared the soft tissue from the anterior cervical spine using Kitner swabs and then inserted a bent spinal needle into the upper exposed interspace.  I obtained the intraoperative radiograph to confirm our location. I then used electrocautery to detach the medial border of the longus colli muscle bilaterally from the C6-7 intervertebral disk space and then I inserted the Caspar self-retaining retractor for exposure.  I then incised the C6-7 intervertebral disk and performed a partial diskectomy using the pituitary forceps and the Karlin curettes.  I inserted the distraction screws at C6 and C7, distracted the interspace.  I then used the high-speed drill to decorticate the vertebral end plates at Y7-8 and drill away the remainder of the C6-7 intervertebral disk.  I thinned out the posterior longitudinal ligament with the drill, incised it with the arachnoid, and removed it with the Kerrison punch, undercutting the vertebral end plates at G9-5 and decompressing the thecal sac.  I then performed a foraminotomy about the bilateral C7 nerve roots, and there was a huge herniated disk on the left compressing the left C7 nerve root.  I performed a generous foraminotomy about both nerve roots and decompressed  them both well.  I now turned my attention to arthrodesis.  I obtained the iliac crest tricortical allograft bone graft and fashioned it to these approximate dimensions: 7 mm in height, 1 cm in depth.  I inserted the bone graft into the intervertebral disk space and Dr. Jeral Fruit  tapped it into place.  I then released and removed the distraction screws.  There was a good, snug fit of the bone graft.  We now turned our attention to the anterior spinal instrumentation.  We obtained the appropriate length Synthes plate, laid it along the anterior aspect of the vertebral bodies at C6 and C7, drilled two holes at C6, two at C7, tapped the holes, and then secured the plate to the vertebral bodies with two 14 mm screws at each level.  We then obtained the intraoperative radiograph.  It demonstrated adequate position of the upper screws.  The lower screws could not be seen because of the patients body habitus but looked good in vivo.  We then secured the screws to the plate and secured the locking screw at each screw.  The wound was then copiously irrigated with bacitracin solution, the solution was removed.  We obtained stringent hemostasis with bipolar electrocautery and Gelfoam.  I then removed the Caspar self-retaining retractor and inspected the esophagus for any damage, and there was none apparent.  We then reapproximated the patients platysma muscle with interrupted 3-0 Vicryl suture, subcutaneous tissue with interrupted 3-0 Vicryl suture, and the skin with Steri-Strips and benzoin.  The wound was then coated with bacitracin ointment and sterile dressings applied.  The drapes were removed.  The patient was subsequently extubated by the anesthesia team and transported to the postanesthesia care unit in stable condition.  All sponge, instrument, and needle counts were correct at the end of the case. Dictated by:   Cristi Loron, M.D. Attending Physician:  Tressie Stalker D DD:  06/23/02 TD:  06/26/02 Job: [id number]

## 2011-06-26 ENCOUNTER — Other Ambulatory Visit: Payer: Self-pay | Admitting: Obstetrics and Gynecology

## 2011-07-06 ENCOUNTER — Other Ambulatory Visit: Payer: Self-pay | Admitting: Obstetrics and Gynecology

## 2011-07-06 DIAGNOSIS — N63 Unspecified lump in unspecified breast: Secondary | ICD-10-CM

## 2011-07-12 ENCOUNTER — Other Ambulatory Visit: Payer: Self-pay

## 2011-07-18 ENCOUNTER — Ambulatory Visit
Admission: RE | Admit: 2011-07-18 | Discharge: 2011-07-18 | Disposition: A | Discharge status: Home / Self Care | Payer: BC Managed Care – PPO | Source: Ambulatory Visit | Attending: Obstetrics and Gynecology | Admitting: Obstetrics and Gynecology

## 2011-07-18 ENCOUNTER — Other Ambulatory Visit: Payer: Self-pay | Admitting: Obstetrics and Gynecology

## 2011-07-18 DIAGNOSIS — N63 Unspecified lump in unspecified breast: Secondary | ICD-10-CM

## 2012-03-19 ENCOUNTER — Other Ambulatory Visit: Payer: Self-pay | Admitting: Obstetrics and Gynecology

## 2012-03-19 DIAGNOSIS — N6009 Solitary cyst of unspecified breast: Secondary | ICD-10-CM

## 2012-03-22 ENCOUNTER — Ambulatory Visit
Admission: RE | Admit: 2012-03-22 | Discharge: 2012-03-22 | Disposition: A | Discharge status: Home / Self Care | Payer: BC Managed Care – PPO | Source: Ambulatory Visit | Attending: Obstetrics and Gynecology | Admitting: Obstetrics and Gynecology

## 2012-03-22 DIAGNOSIS — N6009 Solitary cyst of unspecified breast: Secondary | ICD-10-CM

## 2012-06-21 ENCOUNTER — Other Ambulatory Visit: Payer: Self-pay | Admitting: Obstetrics and Gynecology

## 2012-06-21 DIAGNOSIS — Z1231 Encounter for screening mammogram for malignant neoplasm of breast: Secondary | ICD-10-CM

## 2012-07-18 ENCOUNTER — Ambulatory Visit
Admission: RE | Admit: 2012-07-18 | Discharge: 2012-07-18 | Disposition: A | Discharge status: Home / Self Care | Payer: BC Managed Care – PPO | Source: Ambulatory Visit | Attending: Obstetrics and Gynecology | Admitting: Obstetrics and Gynecology

## 2012-07-18 DIAGNOSIS — Z1231 Encounter for screening mammogram for malignant neoplasm of breast: Secondary | ICD-10-CM

## 2012-12-18 HISTORY — PX: BREAST BIOPSY: SHX20

## 2013-01-20 ENCOUNTER — Ambulatory Visit (INDEPENDENT_AMBULATORY_CARE_PROVIDER_SITE_OTHER): Payer: BC Managed Care – PPO | Admitting: Surgery

## 2013-01-20 ENCOUNTER — Encounter (INDEPENDENT_AMBULATORY_CARE_PROVIDER_SITE_OTHER): Payer: Self-pay | Admitting: Surgery

## 2013-01-20 VITALS — BP 150/90 | HR 78 | Temp 97.8°F | Resp 20 | Ht 67.0 in | Wt 208.0 lb

## 2013-01-20 DIAGNOSIS — L723 Sebaceous cyst: Secondary | ICD-10-CM

## 2013-01-20 NOTE — Progress Notes (Signed)
General Surgery [facility]  Chief Complaint  Patient presents with  . New Evaluation    eval abscess on back    HISTORY: Patient is a 52 year old black female who presents with a symptomatic sebaceous cyst in the left mid back. This has been present for some time and slowly enlarging. Over the past week she has developed pain. She was seen by her primary care physician and started on oral antibiotics. She presents today for evaluation for surgical excision.  No past medical history on file.   Current Outpatient Prescriptions  Medication Sig Dispense Refill  . cephALEXin (KEFLEX) 500 MG capsule Take 500 mg by mouth 3 (three) times daily.      . fluticasone (FLONASE) 50 MCG/ACT nasal spray 1-2 sprays daily. To each nostril       . triamterene-hydrochlorothiazide (MAXZIDE-25) 37.5-25 MG per tablet Take 1 tablet by mouth daily.          Allergies  Allergen Reactions  . Aspirin      No family history on file.   History   Social History  . Marital Status: Married    Spouse Name: N/A    Number of Children: N/A  . Years of Education: N/A   Social History Main Topics  . Smoking status: Never Smoker   . Smokeless tobacco: None  . Alcohol Use: No  . Drug Use: No  . Sexually Active:    Other Topics Concern  . None   Social History Narrative  . None     REVIEW OF SYSTEMS - PERTINENT POSITIVES ONLY: No drainage from the mass. No previous cyst excised in the past.  EXAM: Filed Vitals:   01/20/13 1601  BP: 150/90  Pulse: 78  Temp: 97.8 F (36.6 C)  Resp: 20    HEENT: normocephalic; pupils equal and reactive; sclerae clear; dentition good; mucous membranes moist NECK:  symmetric on extension; no palpable anterior or posterior cervical lymphadenopathy; no supraclavicular masses; no tenderness CHEST: clear to auscultation bilaterally without rales, rhonchi, or wheezes CARDIAC: regular rate and rhythm without significant murmur; peripheral  pulses are full BACK:  In the mid back to the left of midline over the paraspinous muscle is a 3 cm soft tissue mass with a small sinus tract to the skin; no fluctuance; mild tenderness; no drainage EXT:  non-tender without edema; no deformity NEURO: no gross focal deficits; no sign of tremor   LABORATORY RESULTS: See [facility] (CHL-Epic) for most recent results   RADIOLOGY RESULTS: See [facility] (CHL-Epic) for most recent results   IMPRESSION: Sebaceous cyst, 3 cm, left mid back, symptomatic  PLAN: Patient is on oral antibiotics from her primary care provider. She would like to have this excised definitively. This can be done as an outpatient procedure under sedation. We will make arrangements for outpatient surgery in the near future at a time convenient for the patient.  The risks and benefits of the procedure have been discussed at length with the patient.  The patient understands the proposed procedure, potential alternative treatments, and the course of recovery to be expected.  All of the patient's questions have been answered at this time.  The patient wishes to proceed with surgery.  Velora Heckler, MD, FACS General & Endocrine Surgery [facility]   Visit Diagnoses: 1. Sebaceous cyst, mid back     Primary Care Physician: Emeterio Reeve, MD

## 2013-01-20 NOTE — Patient Instructions (Signed)

## 2013-03-25 ENCOUNTER — Other Ambulatory Visit: Payer: Self-pay

## 2013-04-30 ENCOUNTER — Other Ambulatory Visit: Payer: Self-pay | Admitting: Obstetrics and Gynecology

## 2013-04-30 DIAGNOSIS — N6320 Unspecified lump in the left breast, unspecified quadrant: Secondary | ICD-10-CM

## 2013-05-09 ENCOUNTER — Other Ambulatory Visit: Payer: BC Managed Care – PPO

## 2013-05-20 ENCOUNTER — Other Ambulatory Visit: Payer: BC Managed Care – PPO

## 2013-06-02 ENCOUNTER — Ambulatory Visit
Admission: RE | Admit: 2013-06-02 | Discharge: 2013-06-02 | Disposition: A | Discharge status: Home / Self Care | Payer: BC Managed Care – PPO | Source: Ambulatory Visit | Attending: Obstetrics and Gynecology | Admitting: Obstetrics and Gynecology

## 2013-06-02 DIAGNOSIS — N6320 Unspecified lump in the left breast, unspecified quadrant: Secondary | ICD-10-CM

## 2013-07-18 LAB — HM MAMMOGRAPHY

## 2013-08-20 ENCOUNTER — Emergency Department (INDEPENDENT_AMBULATORY_CARE_PROVIDER_SITE_OTHER)
Admission: EM | Admit: 2013-08-20 | Discharge: 2013-08-20 | Disposition: A | Discharge status: Home / Self Care | Payer: BC Managed Care – PPO | Source: Home / Self Care

## 2013-08-20 ENCOUNTER — Encounter (HOSPITAL_COMMUNITY): Payer: Self-pay | Admitting: *Deleted

## 2013-08-20 DIAGNOSIS — J302 Other seasonal allergic rhinitis: Secondary | ICD-10-CM

## 2013-08-20 DIAGNOSIS — J309 Allergic rhinitis, unspecified: Secondary | ICD-10-CM

## 2013-08-20 MED ORDER — HYDROCOD POLST-CHLORPHEN POLST 10-8 MG/5ML PO LQCR: 5.0000 mL | Freq: Two times a day (BID) | ORAL | Status: DC | PRN

## 2013-08-20 MED ORDER — FEXOFENADINE HCL 180 MG PO TABS: 180.0000 mg | ORAL_TABLET | Freq: Every day | ORAL | Status: DC

## 2013-08-20 MED ORDER — FLUTICASONE PROPIONATE 50 MCG/ACT NA SUSP: 1.0000 | Freq: Two times a day (BID) | NASAL | Status: DC

## 2013-08-20 NOTE — ED Notes (Signed)
C/o cough since last Sunday.   No sore throat, earache or fever.  Occasional runny nose.  Headache when she coughs.

## 2013-08-20 NOTE — ED Provider Notes (Signed)
CSN: [id number]     Arrival date & time 08/20/13  1843 History   None    Chief Complaint  Patient presents with  . Cough   (Consider location/radiation/quality/duration/timing/severity/associated sxs/prior Treatment) Patient is a 52 y.o. female presenting with cough. The history is provided by the patient.  Cough Cough characteristics:  Non-productive and dry Severity:  Moderate Onset quality:  Sudden Duration:  10 days Progression:  Unchanged Chronicity:  New Smoker: no   Context: weather changes   Associated symptoms: rhinorrhea and sinus congestion   Associated symptoms: no chills, no fever, no headaches and no shortness of breath     History reviewed. No pertinent past medical history. Past Surgical History  Procedure Laterality Date  . Uterine fibroid surgery    . Foot surgery     Family History  Problem Relation Age of Onset  . Hypertension Mother   . Diabetes Mother   . Stroke Mother   . Heart failure Father    History  Substance Use Topics  . Smoking status: Never Smoker   . Smokeless tobacco: Not on file  . Alcohol Use: No   OB History   Grav Para Term Preterm Abortions TAB SAB Ect Mult Living                 Review of Systems  Constitutional: Negative.  Negative for fever and chills.  HENT: Positive for congestion, rhinorrhea and postnasal drip.   Respiratory: Positive for cough. Negative for shortness of breath.   Cardiovascular: Negative.   Gastrointestinal: Negative.   Neurological: Negative for headaches.    Allergies  Aspirin  Home Medications   Current Outpatient Rx  Name  Route  Sig  Dispense  Refill  . fluticasone (FLONASE) 50 MCG/ACT nasal spray      1-2 sprays daily. To each nostril          . cephALEXin (KEFLEX) 500 MG capsule   Oral   Take 500 mg by mouth 3 (three) times daily.         . chlorpheniramine-HYDROcodone (TUSSIONEX PENNKINETIC ER) 10-8 MG/5ML LQCR   Oral   Take 5 mLs by mouth every 12 (twelve) hours as  needed. For cough   115 mL   1   . fexofenadine (ALLEGRA) 180 MG tablet   Oral   Take 1 tablet (180 mg total) by mouth daily.   30 tablet   1   . fluticasone (FLONASE) 50 MCG/ACT nasal spray   Nasal   Place 1 spray into the nose 2 (two) times daily.   1 g   2   . triamterene-hydrochlorothiazide (MAXZIDE-25) 37.5-25 MG per tablet   Oral   Take 1 tablet by mouth daily.           BP 159/60  Pulse 76  Temp(Src) 98.6 F (37 C) (Oral)  Resp 17  SpO2 97%  LMP 08/15/2013 Physical Exam  Nursing note and vitals reviewed. Constitutional: She is oriented to person, place, and time. She appears well-developed and well-nourished.  HENT:  Head: Normocephalic.  Right Ear: External ear normal.  Left Ear: External ear normal.  Mouth/Throat: Oropharynx is clear and moist.  Eyes: Conjunctivae are normal. Pupils are equal, round, and reactive to light.  Neck: Normal range of motion. Neck supple.  Cardiovascular: Normal rate, regular rhythm, normal heart sounds and intact distal pulses.   Pulmonary/Chest: Effort normal and breath sounds normal.  Lymphadenopathy:    She has no cervical adenopathy.  Neurological: She is  alert and oriented to person, place, and time.  Skin: Skin is warm and dry.    ED Course  Procedures (including critical care time) Labs Review Labs Reviewed - No data to display Imaging Review No results found.  MDM   1. Seasonal allergic rhinitis        Linna Hoff, MD 08/20/13 2047

## 2013-08-27 NOTE — ED Notes (Signed)
Call from patient , asking why she is still having syx ;advised we will be delighted to recheck her if continued problems, or she can go to her regular MD for continued issed

## 2014-07-28 LAB — HM PAP SMEAR

## 2014-07-29 ENCOUNTER — Encounter: Payer: Self-pay | Admitting: Adult Health

## 2014-07-29 ENCOUNTER — Ambulatory Visit (INDEPENDENT_AMBULATORY_CARE_PROVIDER_SITE_OTHER): Payer: BC Managed Care – PPO | Admitting: Adult Health

## 2014-07-29 VITALS — BP 152/90 | HR 72 | Temp 98.1°F | Resp 14 | Ht 68.0 in | Wt 205.5 lb

## 2014-07-29 DIAGNOSIS — R030 Elevated blood-pressure reading, without diagnosis of hypertension: Secondary | ICD-10-CM

## 2014-07-29 DIAGNOSIS — I10 Essential (primary) hypertension: Secondary | ICD-10-CM

## 2014-07-29 DIAGNOSIS — Z23 Encounter for immunization: Secondary | ICD-10-CM

## 2014-07-29 DIAGNOSIS — Z139 Encounter for screening, unspecified: Secondary | ICD-10-CM

## 2014-07-29 DIAGNOSIS — Z13220 Encounter for screening for lipoid disorders: Secondary | ICD-10-CM

## 2014-07-29 LAB — LIPID PANEL
CHOLESTEROL: 193 mg/dL (ref 0–200)
HDL: 48 mg/dL (ref >39.00)
LDL Cholesterol: 124 mg/dL — ABNORMAL HIGH (ref 0–99)
NonHDL: 145
TRIGLYCERIDES: 103 mg/dL (ref 0.0–149.0)
Total CHOL/HDL Ratio: 4
VLDL: 20.6 mg/dL (ref 0.0–40.0)

## 2014-07-29 LAB — BASIC METABOLIC PANEL
BUN: 13 mg/dL (ref 6–23)
CHLORIDE: 104 meq/L (ref 96–112)
CO2: 28 meq/L (ref 19–32)
Calcium: 9.3 mg/dL (ref 8.4–10.5)
Creatinine, Ser: 0.5 mg/dL (ref 0.4–1.2)
GFR: 163.6 mL/min (ref >60.00)
GLUCOSE: 80 mg/dL (ref 70–99)
POTASSIUM: 4.1 meq/L (ref 3.5–5.1)
SODIUM: 137 meq/L (ref 135–145)

## 2014-07-29 MED ORDER — TRIAMTERENE-HCTZ 37.5-25 MG PO TABS: 1.0000 | ORAL_TABLET | Freq: Every day | ORAL | Status: DC

## 2014-07-29 NOTE — Progress Notes (Signed)
Pre visit review using our clinic review tool, if applicable. No additional management support is needed unless otherwise documented below in the visit note. 

## 2014-07-29 NOTE — Progress Notes (Signed)
Patient ID: Courtney Sheppard, female   DOB: [birth year], 53 y.o.   MRN: [id number]    Subjective:    Patient ID: Courtney Sheppard, female    DOB: February 28, 1961, 53 y.o.   MRN: [id number]  HPI Patient is a pleasant 53 year old female who presents to clinic to establish care. Previously followed at [facility]. Will request medical records. Patient is also followed by Dr. Maudry Diego at [facility]. PAP Up to date. Mammogram scheduled. She reports that Dr. Maudry Diego was referring her for screening colonoscopy.  Pt reports that she has never had chickenpox. No vaccination reported. She needs hepatitis b vaccination. 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  History of elevated blood pressure. Previous physician had recommended that she begin blood pressure medication. She has not started this. Strong family history of stroke, diabetes, hypertension and heart disease.   History reviewed. No pertinent past medical history.   Past Surgical History  Procedure Laterality Date  . Uterine fibroid surgery    . Foot surgery    . Breast biopsy  2014     Family History  Problem Relation Age of Onset  . Hypertension Mother   . Diabetes Mother   . Stroke Mother     3 strokes  . Heart failure Father   . Heart disease Father   . Alcohol abuse Brother   . Alcohol abuse Brother      History   Social History  . Marital Status: Married    Spouse Name: N/A    Number of Children: N/A  . Years of Education: N/A   Occupational History  . aesthetician     [employer]   Social History Main Topics  . Smoking status: Never Smoker   . Smokeless tobacco: Not on file  . Alcohol Use: No  . Drug Use: No  . Sexual Activity: Yes    Birth Control/ Protection: None   Other Topics Concern  . Not on file   Social History Narrative   Courtney "Kenney Houseman" grew up in [place]. She is married and has a step daughter. She works as an Engineering geologist.       Hobbies: Golfing, Tennis    Caffeine: 2 cups   Exercise: Tennis    Current Outpatient Prescriptions on File Prior to Visit  Medication Sig Dispense Refill  . fexofenadine (ALLEGRA) 180 MG tablet Take 1 tablet (180 mg total) by mouth daily.  30 tablet  1  . fluticasone (FLONASE) 50 MCG/ACT nasal spray Place 1 spray into the nose 2 (two) times daily.  1 g  2   No current facility-administered medications on file prior to visit.     Review of Systems  Constitutional: Negative.   HENT: Negative.   Eyes: Negative.   Respiratory: Negative.   Cardiovascular: Negative.   Gastrointestinal: Negative.   Endocrine: Negative.   Genitourinary: Negative.   Musculoskeletal: Negative.   Skin: Negative.   Allergic/Immunologic: Negative.   Neurological: Negative.   Hematological: Negative.   Psychiatric/Behavioral: Negative.        Objective:  BP 152/90  Pulse 72  Temp(Src) 98.1 F (36.7 C) (Oral)  Resp 14  Ht 5\' 8"  (1.727 m)  Wt 205 lb 8 oz (93.214 kg)  BMI 31.25 kg/m2  SpO2 99%   Physical Exam  Constitutional: She is oriented to person, place, and time. No distress.  HENT:  Head: Normocephalic and atraumatic.  Eyes: Conjunctivae and EOM are normal.  Neck: Normal  range of motion. Neck supple.  Cardiovascular: Normal rate, regular rhythm, normal heart sounds and intact distal pulses.  Exam reveals no gallop and no friction rub.   No murmur heard. Pulmonary/Chest: Effort normal and breath sounds normal. No respiratory distress. She has no wheezes. She has no rales.  Musculoskeletal: Normal range of motion.  Neurological: She is alert and oriented to person, place, and time. She has normal reflexes. Coordination normal.  Skin: Skin is warm and dry.  Psychiatric: She has a normal mood and affect. Her behavior is normal. Judgment and thought content normal.      Assessment & Plan:   1. Screening cholesterol level She is fasting so we will go ahead and screen for HLD. - Lipid panel  2. Essential  hypertension Discussion regarding elevated blood pressure readings and the effects on other organs over time. Mother has had 3 strokes and she reports that her mother does not have afib. Recommend starting medication that was recommended previously and ordered but she never started. She is agreeable to this. We discussed that weight contributes to several problems including HTN, HLD and risk for diabetes. She is trying to lose weight. Plays tennis for exercise but has not done so in a little while. Encouraged increasing activity and overall improving her health and decreasing chances of developing any of the above. - triamterene-hydrochlorothiazide (MAXZIDE-25) 37.5-25 MG per tablet; Take 1 tablet by mouth daily.  Dispense: 30 tablet; Refill: 5 - Basic metabolic panel  3. Screening I am checking for immunity. She reports no chicken pox and she has never been vaccinated. - Varicella zoster antibody, IgG  4. Need for hepatitis B vaccination Series started today.

## 2014-07-30 ENCOUNTER — Encounter: Payer: Self-pay | Admitting: *Deleted

## 2014-07-30 LAB — VARICELLA ZOSTER ANTIBODY, IGG: VARICELLA IGG: 683.6 {index} — AB (ref <135.00)

## 2014-08-13 ENCOUNTER — Encounter: Payer: Self-pay | Admitting: Adult Health

## 2014-08-13 ENCOUNTER — Ambulatory Visit (INDEPENDENT_AMBULATORY_CARE_PROVIDER_SITE_OTHER): Payer: BC Managed Care – PPO | Admitting: Adult Health

## 2014-08-13 VITALS — BP 122/85 | HR 77 | Temp 98.5°F | Resp 14 | Ht 68.0 in | Wt 206.5 lb

## 2014-08-13 DIAGNOSIS — B351 Tinea unguium: Secondary | ICD-10-CM

## 2014-08-13 DIAGNOSIS — Z0000 Encounter for general adult medical examination without abnormal findings: Secondary | ICD-10-CM

## 2014-08-13 DIAGNOSIS — Z1283 Encounter for screening for malignant neoplasm of skin: Secondary | ICD-10-CM

## 2014-08-13 NOTE — Progress Notes (Signed)
Pre visit review using our clinic review tool, if applicable. No additional management support is needed unless otherwise documented below in the visit note. 

## 2014-08-13 NOTE — Progress Notes (Signed)
Patient ID: Courtney Sheppard, female   DOB: 1961/08/24, 53 y.o.   MRN: [id number]   Subjective:    Patient ID: Courtney Sheppard, female    DOB: 1961-03-16, 53 y.o.   MRN: [id number]  HPI Patient is a pleasant 53 year old female who presents to clinic for her annual physical excluding GYN, breast exam which was done earlier this month. Patient is scheduled for a colonoscopy which was ordered by her GYN. She will send records once this is done. No concerns this visit.    No past medical history on file.   Past Surgical History  Procedure Laterality Date  . Uterine fibroid surgery    . Foot surgery    . Breast biopsy  2014     Family History  Problem Relation Age of Onset  . Hypertension Mother   . Diabetes Mother   . Stroke Mother     3 strokes  . Heart failure Father   . Heart disease Father   . Alcohol abuse Brother   . Alcohol abuse Brother      History   Social History  . Marital Status: Married    Spouse Name: N/A    Number of Children: N/A  . Years of Education: N/A   Occupational History  . aesthetician     [employer]   Social History Main Topics  . Smoking status: Never Smoker   . Smokeless tobacco: Not on file  . Alcohol Use: No  . Drug Use: No  . Sexual Activity: Yes    Birth Control/ Protection: None   Other Topics Concern  . Not on file   Social History Narrative   Courtney "Kenney Houseman" grew up in [place]. She is married and has a step daughter. She works as an Engineering geologist.       Hobbies: Golfing, Tennis   Caffeine: 2 cups   Exercise: Tennis     Current Outpatient Prescriptions on File Prior to Visit  Medication Sig Dispense Refill  . fexofenadine (ALLEGRA) 180 MG tablet Take 1 tablet (180 mg total) by mouth daily.  30 tablet  1  . fluticasone (FLONASE) 50 MCG/ACT nasal spray Place 1 spray into the nose 2 (two) times daily.  1 g  2  . triamterene-hydrochlorothiazide (MAXZIDE-25) 37.5-25 MG per tablet Take 1 tablet by mouth daily.  30 tablet   5   No current facility-administered medications on file prior to visit.     Review of Systems  Constitutional: Negative.   HENT: Negative.   Eyes: Negative.   Respiratory: Negative.   Cardiovascular: Negative.   Gastrointestinal: Negative.   Endocrine: Negative.   Genitourinary: Negative.   Musculoskeletal: Negative.   Skin: Negative.   Allergic/Immunologic: Negative.   Neurological: Negative.   Hematological: Negative.   Psychiatric/Behavioral: Negative.        Objective:  BP 122/85  Pulse 77  Temp(Src) 98.5 F (36.9 C) (Oral)  Resp 14  Ht 5\' 8"  (1.727 m)  Wt 206 lb 8 oz (93.668 kg)  BMI 31.41 kg/m2  SpO2 99%   Physical Exam  Constitutional: She is oriented to person, place, and time. She appears well-developed and well-nourished. No distress.  HENT:  Head: Normocephalic and atraumatic.  Right Ear: External ear normal.  Left Ear: External ear normal.  Nose: Nose normal.  Mouth/Throat: Oropharynx is clear and moist.  Eyes: Conjunctivae and EOM are normal. Pupils are equal, round, and reactive to light.  Neck: Normal range of motion. Neck supple. No tracheal  deviation present. No thyromegaly present.  Cardiovascular: Normal rate, regular rhythm, normal heart sounds and intact distal pulses.  Exam reveals no gallop and no friction rub.   No murmur heard. Pulmonary/Chest: Effort normal and breath sounds normal. No respiratory distress. She has no wheezes. She has no rales.  Abdominal: Soft. Bowel sounds are normal. She exhibits no distension and no mass. There is no tenderness. There is no rebound and no guarding.  Musculoskeletal: Normal range of motion. She exhibits no edema and no tenderness.  Lymphadenopathy:    She has no cervical adenopathy.  Neurological: She is alert and oriented to person, place, and time. She has normal reflexes. No cranial nerve deficit. Coordination normal.  Skin: Skin is warm and dry.  Multiple nevi on back, chest, abdomen.  Onychomycosis of bilateral great toenails  Psychiatric: She has a normal mood and affect. Her behavior is normal. Judgment and thought content normal.      Assessment & Plan:   1. Routine general medical examination at a health care facility Normal physical exam excluding GYN/PAP which was done at her GYN. All screening addressed separately.  2. Onychomycosis Noted of bilateral great toenails during physical exam. Will refer to podiatry. - Ambulatory referral to Podiatry  3. Screening for skin cancer Multiple nevi noted during physical exam. Will refer to Dermatology for skin screening - Ambulatory referral to Dermatology

## 2014-08-26 ENCOUNTER — Ambulatory Visit (INDEPENDENT_AMBULATORY_CARE_PROVIDER_SITE_OTHER): Payer: BC Managed Care – PPO

## 2014-08-26 DIAGNOSIS — Z23 Encounter for immunization: Secondary | ICD-10-CM

## 2014-08-27 ENCOUNTER — Ambulatory Visit: Payer: BC Managed Care – PPO

## 2014-08-28 ENCOUNTER — Ambulatory Visit (INDEPENDENT_AMBULATORY_CARE_PROVIDER_SITE_OTHER): Payer: BC Managed Care – PPO | Admitting: Podiatrist

## 2014-08-28 ENCOUNTER — Encounter: Payer: Self-pay | Admitting: Podiatrist

## 2014-08-28 VITALS — BP 153/85 | HR 69 | Resp 13 | Ht 68.0 in | Wt 206.0 lb

## 2014-08-28 DIAGNOSIS — B351 Tinea unguium: Secondary | ICD-10-CM

## 2014-08-28 MED ORDER — TAVABOROLE 5 % EX SOLN: 1.0000 [drp] | CUTANEOUS | Status: DC

## 2014-08-28 MED ORDER — TERBINAFINE HCL 250 MG PO TABS: 250.0000 mg | ORAL_TABLET | Freq: Every day | ORAL | Status: DC

## 2014-08-28 NOTE — Progress Notes (Signed)
   Subjective:    Patient ID: Courtney Sheppard, female    DOB: 1961-06-04, 53 y.o.   MRN: [id number]  HPI Comments: Pt states she has had thick, discolored B/L 1st toenails for 1 year.  Pt states she has been using FungusFix for about 2 weeks.     Review of Systems  All other systems reviewed and are negative.      Objective:   Physical Exam  Patient is awake, alert, and oriented x 3.  In no acute distress.  Vascular status is intact with palpable pedal pulses at 2/4 DP and PT bilateral and capillary refill time within normal limits. Neurological sensation is also intact bilaterally via Semmes Weinstein monofilament at 5/5 sites. Light touch, vibratory sensation, Achilles tendon reflex is intact. Dermatological exam reveals skin color, turger and texture as normal. No open lesions present.  Musculature intact with dorsiflexion, plantarflexion, inversion, eversion.  Left hallux nail is thickened, discolored, dystrophic and appears clinically mycotic. Yellowish brownish discoloration is present subungually. Right hallux nail has some thickness at the distal portion of the nail the overall thickness is much better however than the left hallux nail.    Assessment & Plan:  Onychomycosis left hallux nail  Plan: To the sample of the nail and sent off for culture. Starting her Lamisil and Kerydin.  We'll see her back in a month for recheck. Will call with the results of the fungal culture to let her know if she should start taking the Lamisil.

## 2014-08-28 NOTE — Patient Instructions (Signed)
I have written out a prescription for Lamisil-- it is the oral medication used to treat toenail fungus-- we will need to do a blood test after 1 month on the medication to make sure it is safe (it always is.. Just a precaution)  Cranford Mon is a topical nail film that will be mailed to you-- the rx has been called into the pharmacy recommended by the manufacturer to make sure it is covered under your insurance.  Call if you have any ?'s or concerns!

## 2014-09-02 ENCOUNTER — Telehealth: Payer: Self-pay | Admitting: General Practice

## 2014-09-02 NOTE — Telephone Encounter (Signed)
Left message for the patient to call the office to schedule her physical for next year with Dr. Nicki Reaper.

## 2014-09-15 NOTE — Telephone Encounter (Signed)
Patient has been scheduled for her CPE with Dr. Nicki Reaper in 2016.

## 2014-09-24 ENCOUNTER — Telehealth: Payer: Self-pay | Admitting: *Deleted

## 2014-09-24 NOTE — Telephone Encounter (Signed)
I left the patient a message that it is okay to start the medication.  Dr. Valentina Lucks got your culture back.  Call if you have any questions or concerns.

## 2014-09-25 ENCOUNTER — Encounter: Payer: Self-pay | Admitting: Podiatrist

## 2014-09-25 ENCOUNTER — Ambulatory Visit (INDEPENDENT_AMBULATORY_CARE_PROVIDER_SITE_OTHER): Payer: BC Managed Care – PPO | Admitting: Podiatrist

## 2014-09-25 VITALS — BP 125/78 | HR 74 | Resp 16

## 2014-09-25 DIAGNOSIS — B351 Tinea unguium: Secondary | ICD-10-CM

## 2014-09-25 DIAGNOSIS — Z79899 Other long term (current) drug therapy: Secondary | ICD-10-CM

## 2014-09-25 NOTE — Progress Notes (Signed)
   Subjective:    Patient ID: Courtney Sheppard, female    DOB: 1961-11-19, 53 y.o.   MRN: [id number]  HPI Pt presents for fungal nail recheck, left great toe, some growth noted   Review of Systems     Objective:   Physical Exam Neurovascular status intact and unchanged. There's been some new growth of the left hallux nail from the Lamisil therapy. Overall she states she's doing well no medication is having no side effects.       Assessment & Plan:  Onychomycosis left hallux nail on Lamisil therapy  Plan: We are repeating the blood tests at today's visit. Instructed her to continue with the Lamisil. She'll be seen back in the future as needed or will call if any problems or concerns arise.

## 2015-01-12 ENCOUNTER — Telehealth: Payer: Self-pay | Admitting: *Deleted

## 2015-01-12 DIAGNOSIS — Z139 Encounter for screening, unspecified: Secondary | ICD-10-CM

## 2015-01-12 DIAGNOSIS — Z1211 Encounter for screening for malignant neoplasm of colon: Secondary | ICD-10-CM

## 2015-01-12 NOTE — Telephone Encounter (Signed)
Per Raquel's last note, pts gyn had referred her for colonoscopy.  Does she need another referral.  Who does she see and what is the reason for opthalmology referral.  (which ophthalmologist does she see?

## 2015-01-12 NOTE — Telephone Encounter (Signed)
Pt called requesting referral to Opthomology and schedule a Colonoscopy.  Please advise

## 2015-01-13 NOTE — Telephone Encounter (Signed)
Spoke with pt, as per her new insurance [employer] the referral needs to come from the PCP.  She would like the Opthalmology referral to go to [facility], [phone]; pt has appoint scheduled for 2.5.16.  Colonoscopy referral to go to [facility] in [place] [address] Cindee Salt.

## 2015-01-14 NOTE — Telephone Encounter (Signed)
Orders placed for GI referral and [place]

## 2015-01-14 NOTE — Telephone Encounter (Signed)
Orders have been placed for referrals.  Courtney Sheppard will be contacting her with an appt date and time.

## 2015-03-04 ENCOUNTER — Telehealth: Payer: Self-pay

## 2015-03-04 ENCOUNTER — Emergency Department (HOSPITAL_BASED_OUTPATIENT_CLINIC_OR_DEPARTMENT_OTHER)
Admission: EM | Admit: 2015-03-04 | Discharge: 2015-03-04 | Disposition: A | Discharge status: Home / Self Care | Payer: 59 | Attending: Emergency Medicine | Admitting: Emergency Medicine

## 2015-03-04 ENCOUNTER — Encounter (HOSPITAL_BASED_OUTPATIENT_CLINIC_OR_DEPARTMENT_OTHER): Payer: Self-pay | Admitting: *Deleted

## 2015-03-04 DIAGNOSIS — L720 Epidermal cyst: Secondary | ICD-10-CM | POA: Insufficient documentation

## 2015-03-04 DIAGNOSIS — I10 Essential (primary) hypertension: Secondary | ICD-10-CM | POA: Insufficient documentation

## 2015-03-04 DIAGNOSIS — L089 Local infection of the skin and subcutaneous tissue, unspecified: Secondary | ICD-10-CM

## 2015-03-04 DIAGNOSIS — L02212 Cutaneous abscess of back [any part, except buttock]: Secondary | ICD-10-CM | POA: Diagnosis present

## 2015-03-04 DIAGNOSIS — Z79899 Other long term (current) drug therapy: Secondary | ICD-10-CM | POA: Insufficient documentation

## 2015-03-04 DIAGNOSIS — Z7951 Long term (current) use of inhaled steroids: Secondary | ICD-10-CM | POA: Diagnosis not present

## 2015-03-04 MED ORDER — HYDROCODONE-ACETAMINOPHEN 5-325 MG PO TABS: 1.0000 | ORAL_TABLET | Freq: Four times a day (QID) | ORAL | Status: DC | PRN

## 2015-03-04 MED ORDER — LIDOCAINE-EPINEPHRINE 2 %-1:100000 IJ SOLN
10.0000 mL | Freq: Once | INTRAMUSCULAR | Status: AC
  Administered 2015-03-04: 10 mL via INTRADERMAL
  Filled 2015-03-04: qty 1

## 2015-03-04 NOTE — Telephone Encounter (Signed)
Pt states she needs referral per insurance

## 2015-03-04 NOTE — ED Notes (Signed)
Pt reports she has an abscess mid back that is painful and swollen x 1 day- same area drained 1 year ago- checked by dermatologist at end of year was told it looked fine

## 2015-03-04 NOTE — ED Provider Notes (Signed)
CSN: [id number]     Arrival date & time 03/04/15  1325 History   First MD Initiated Contact with Patient 03/04/15 1555     Chief Complaint  Patient presents with  . Abscess     (Consider location/radiation/quality/duration/timing/severity/associated sxs/prior Treatment) HPI   54 year old female who presents for evaluation of a abscess. Patient noticed an area of swelling and tenderness to her back approximate 2 days ago. Pain is described as a sharp sensation worsening with palpation. This is the same area where she had an abscess in year ago which was drained by a dermatologist. She has had a fever all rash. She is up-to-date with tetanus. She has minimal tenderness at this time. She has no other complaint, no chest pain or shortness of breath.  Past Medical History  Diagnosis Date  . Hypertension    Past Surgical History  Procedure Laterality Date  . Uterine fibroid surgery    . Foot surgery    . Breast biopsy  2014   Family History  Problem Relation Age of Onset  . Hypertension Mother   . Diabetes Mother   . Stroke Mother     3 strokes  . Heart failure Father   . Heart disease Father   . Alcohol abuse Brother   . Alcohol abuse Brother    History  Substance Use Topics  . Smoking status: Never Smoker   . Smokeless tobacco: Not on file  . Alcohol Use: No   OB History    No data available     Review of Systems  Constitutional: Negative for fever.  Musculoskeletal: Positive for back pain.  Skin: Negative for rash and wound.  Neurological: Negative for numbness.      Allergies  Aspirin  Home Medications   Prior to Admission medications   Medication Sig Start Date End Date Taking? Authorizing Provider  fexofenadine (ALLEGRA) 180 MG tablet Take 1 tablet (180 mg total) by mouth daily. 08/20/13  Yes Billy Fischer, MD  fluticasone (FLONASE) 50 MCG/ACT nasal spray Place 1 spray into the nose 2 (two) times daily. 08/20/13  Yes Billy Fischer, MD   triamterene-hydrochlorothiazide (MAXZIDE-25) 37.5-25 MG per tablet Take 1 tablet by mouth daily. 07/29/14  Yes Raquel Dagoberto Ligas, NP  Tavaborole (KERYDIN) 5 % SOLN Apply 1 drop topically 1 day or 1 dose. Apply 1 drop to the toenail daily. 08/28/14   Bronson Ing, DPM  terbinafine (LAMISIL) 250 MG tablet Take 1 tablet (250 mg total) by mouth daily. 08/28/14   Viona Gilmore Egerton, DPM   BP 167/93 mmHg  Pulse 68  Temp(Src) 98.2 F (36.8 C)  Resp 18  Ht 5\' 7"  (1.702 m)  Wt 197 lb (89.359 kg)  BMI 30.85 kg/m2  SpO2 100% Physical Exam  Constitutional: She appears well-developed and well-nourished. No distress.  HENT:  Head: Atraumatic.  Eyes: Conjunctivae are normal.  Neck: Neck supple.  Cardiovascular: Normal rate and regular rhythm.   Pulmonary/Chest: Effort normal and breath sounds normal.  Neurological: She is alert.  Skin: No rash noted.  An area of induration and fluctuance noted to patient's left middle back measuring size of a quarter. Tenderness to palpation, no surrounding erythema.  Psychiatric: She has a normal mood and affect.  Nursing note and vitals reviewed.   ED Course  Procedures (including critical care time)  Patient presents with an abscess, suspect an infected epidermal cyst.  Will perform I&D  INCISION AND DRAINAGE Performed by: Domenic Moras Consent: Verbal consent obtained. Risks  and benefits: risks, benefits and alternatives were discussed Type: abscess  Body area: Left mid back  Anesthesia: local infiltration  Incision was made with a scalpel.  Local anesthetic: lidocaine 2% w epinephrine  Anesthetic total: 2 ml  Complexity: complex Blunt dissection to break up loculations  Drainage: purulent  Drainage amount: small  Packing material: none  Patient tolerance: Patient tolerated the procedure well with no immediate complications.     Labs  Review Labs Reviewed - No data to display  Imaging Review No results found.   EKG  Interpretation None      MDM   Final diagnoses:  Infected epidermoid cyst    BP 167/93 mmHg  Pulse 68  Temp(Src) 98.2 F (36.8 C)  Resp 18  Ht 5\' 7"  (1.702 m)  Wt 197 lb (89.359 kg)  BMI 30.85 kg/m2  SpO2 100%     Domenic Moras, PA-C 03/04/15 [place], MD 03/05/15 [phone]

## 2015-03-04 NOTE — Telephone Encounter (Signed)
Pt is already an established patient with Dr. Allyson Sabal. Advised to contact their office first to schedule appt and let us know if she still needs a referral

## 2015-03-04 NOTE — Discharge Instructions (Signed)

## 2015-03-04 NOTE — Telephone Encounter (Signed)
The patient called and is hoping to get a referral to her dermatologist for a spot on her back.  [facility] dermatology)  Pt callback - [phone]

## 2015-03-04 NOTE — ED Notes (Signed)
PA at bedside performing I&D

## 2015-03-05 ENCOUNTER — Other Ambulatory Visit: Payer: Self-pay | Admitting: Internal Medicine

## 2015-03-05 DIAGNOSIS — L989 Disorder of the skin and subcutaneous tissue, unspecified: Secondary | ICD-10-CM

## 2015-03-05 NOTE — Telephone Encounter (Signed)
Pt did state she still needed derm referral

## 2015-03-05 NOTE — Progress Notes (Signed)
Order placed for dermatology referral.  

## 2015-03-05 NOTE — Telephone Encounter (Signed)
I have placed the order for the referral.  It appears pt was seen in ER 03/04/15 for evaluation of the back lesion (infected cyst).  Does she still need the referral.  If no, let Bonnita Nasuti know.  I have not seen the pt previously.  Was Raquel Rey pt.

## 2015-03-05 NOTE — Telephone Encounter (Signed)
Attempted to speak to patient today, but was very rude to me on the phone. She was upset that I called her yesterday and asked her to call her dermatologist first since she is already an established patient, that she may be able to get the appointment quicker. That because of our slowness she had to go to the ED to be seen. Advised that no time did she tell me the urgency of her needing an appointment to dermatology. She states she expected to be called ASAP yesterday about an appointment and why did I call her since I am not even in referrals. Explained I was one of the nurses, just trying to get her a quicker appt if a referral wasn't even needed. Pt stated how would I know since I am not in referrals and wouldn't know what her insurance expects. Demanded to speak to [place].

## 2015-04-23 ENCOUNTER — Encounter (HOSPITAL_COMMUNITY): Payer: Self-pay | Admitting: *Deleted

## 2015-04-23 ENCOUNTER — Emergency Department (INDEPENDENT_AMBULATORY_CARE_PROVIDER_SITE_OTHER)
Admission: EM | Admit: 2015-04-23 | Discharge: 2015-04-23 | Disposition: A | Discharge status: Home / Self Care | Payer: 59 | Source: Home / Self Care | Attending: Family Medicine | Admitting: Family Medicine

## 2015-04-23 DIAGNOSIS — J302 Other seasonal allergic rhinitis: Secondary | ICD-10-CM

## 2015-04-23 MED ORDER — IPRATROPIUM BROMIDE 0.06 % NA SOLN: 2.0000 | Freq: Four times a day (QID) | NASAL | Status: DC

## 2015-04-23 MED ORDER — METHYLPREDNISOLONE ACETATE 80 MG/ML IJ SUSP
INTRAMUSCULAR | Status: AC
Start: 2015-04-23 — End: 2015-04-23
  Filled 2015-04-23: qty 1

## 2015-04-23 MED ORDER — CETIRIZINE HCL 10 MG PO TABS: 10.0000 mg | ORAL_TABLET | Freq: Every day | ORAL | Status: DC

## 2015-04-23 MED ORDER — TRIAMCINOLONE ACETONIDE 40 MG/ML IJ SUSP
INTRAMUSCULAR | Status: AC
  Filled 2015-04-23: qty 1

## 2015-04-23 MED ORDER — TRIAMCINOLONE ACETONIDE 40 MG/ML IJ SUSP
40.0000 mg | Freq: Once | INTRAMUSCULAR | Status: AC
  Administered 2015-04-23: 40 mg via INTRAMUSCULAR

## 2015-04-23 MED ORDER — METHYLPREDNISOLONE ACETATE 40 MG/ML IJ SUSP
80.0000 mg | Freq: Once | INTRAMUSCULAR | Status: AC
  Administered 2015-04-23: 80 mg via INTRAMUSCULAR

## 2015-04-23 NOTE — ED Provider Notes (Signed)
CSN: [id number]     Arrival date & time 04/23/15  1515 History   First MD Initiated Contact with Patient 04/23/15 1603     Chief Complaint  Patient presents with  . URI   (Consider location/radiation/quality/duration/timing/severity/associated sxs/prior Treatment) Patient is a 54 y.o. female presenting with URI. The history is provided by the patient.  URI Presenting symptoms: congestion, cough and rhinorrhea   Presenting symptoms: no fever and no sore throat   Presenting symptoms comment:  Just back from 1 wk in [place], sx after return. Severity:  Mild Onset quality:  Gradual Duration:  1 week Progression:  Unchanged Chronicity:  New Associated symptoms: sneezing   Associated symptoms: no wheezing   Risk factors: recent travel     Past Medical History  Diagnosis Date  . Hypertension    Past Surgical History  Procedure Laterality Date  . Uterine fibroid surgery    . Foot surgery    . Breast biopsy  2014   Family History  Problem Relation Age of Onset  . Hypertension Mother   . Diabetes Mother   . Stroke Mother     3 strokes  . Heart failure Father   . Heart disease Father   . Alcohol abuse Brother   . Alcohol abuse Brother    History  Substance Use Topics  . Smoking status: Never Smoker   . Smokeless tobacco: Not on file  . Alcohol Use: No   OB History    No data available     Review of Systems  Constitutional: Negative.  Negative for fever.  HENT: Positive for congestion, postnasal drip, rhinorrhea and sneezing. Negative for sore throat.   Respiratory: Positive for cough. Negative for wheezing.   Cardiovascular: Negative.   Gastrointestinal: Negative.   Genitourinary: Negative.   Skin: Negative.     Allergies  Aspirin  Home Medications   Prior to Admission medications   Medication Sig Start Date End Date Taking? Authorizing Provider  cetirizine (ZYRTEC) 10 MG tablet Take 1 tablet (10 mg total) by mouth daily. 04/23/15   Billy Fischer, MD   fexofenadine (ALLEGRA) 180 MG tablet Take 1 tablet (180 mg total) by mouth daily. 08/20/13   Billy Fischer, MD  fluticasone (FLONASE) 50 MCG/ACT nasal spray Place 1 spray into the nose 2 (two) times daily. 08/20/13   Billy Fischer, MD  HYDROcodone-acetaminophen (NORCO/VICODIN) 5-325 MG per tablet Take 1 tablet by mouth every 6 (six) hours as needed for moderate pain. 03/04/15   Domenic Moras, PA-C  ipratropium (ATROVENT) 0.06 % nasal spray Place 2 sprays into both nostrils 4 (four) times daily. 04/23/15   Billy Fischer, MD  Tavaborole (KERYDIN) 5 % SOLN Apply 1 drop topically 1 day or 1 dose. Apply 1 drop to the toenail daily. 08/28/14   Bronson Ing, DPM  terbinafine (LAMISIL) 250 MG tablet Take 1 tablet (250 mg total) by mouth daily. 08/28/14   Bronson Ing, DPM  triamterene-hydrochlorothiazide (MAXZIDE-25) 37.5-25 MG per tablet Take 1 tablet by mouth daily. 07/29/14   Raquel M Rey, NP   BP 154/88 mmHg  Pulse 85  Temp(Src) 99.1 F (37.3 C) (Oral)  Resp 16  SpO2 95% Physical Exam  Constitutional: She is oriented to person, place, and time. She appears well-developed and well-nourished. No distress.  HENT:  Right Ear: External ear normal.  Left Ear: External ear normal.  Nose: Mucosal edema and rhinorrhea present. No sinus tenderness.  Mouth/Throat: Oropharynx is clear and moist.  Eyes: Conjunctivae are normal. Pupils are equal, round, and reactive to light.  Neck: Normal range of motion. Neck supple.  Cardiovascular: Normal rate, normal heart sounds and intact distal pulses.   Pulmonary/Chest: Effort normal and breath sounds normal.  Neurological: She is alert and oriented to person, place, and time.  Skin: Skin is warm and dry.  Nursing note and vitals reviewed.   ED Course  Procedures (including critical care time) Labs Review Labs Reviewed - No data to display  Imaging Review No results found.   MDM   1. Seasonal allergic rhinitis        Billy Fischer, MD 04/23/15  [phone]

## 2015-04-23 NOTE — ED Notes (Signed)
Pt  Reports   Symptoms  Of  Cough   /   Congestion     - body  Aches       With  A  Non   Productive   Cough  With  Onset of  Symptoms   About  1  Week    Pt  Sitting  Upright on the  Exam table  Speaking in   Complete   sentances  In no  Acute  Distress

## 2015-05-10 ENCOUNTER — Telehealth: Payer: Self-pay | Admitting: Internal Medicine

## 2015-05-10 NOTE — Telephone Encounter (Signed)
The patient is wanting a referral sent in to [facility] to be seen by a chiropractor at [facility] on [address]. For Ergonomic back issues. The patient stated that her back does not hurt all the time just at the end of the day. She informed me that it is due to her job. I am needing diagnosis codes to send in the referral to [facility] . Please advise .

## 2015-05-10 NOTE — Telephone Encounter (Signed)
I have not seen this pt.  In reviewing the chart, it appears she was Raquel's pt.  She will need to establish with someone and then see if they refer to chiropractor.

## 2015-05-19 ENCOUNTER — Telehealth: Payer: Self-pay | Admitting: Internal Medicine

## 2015-05-19 NOTE — Telephone Encounter (Signed)
I am ok with Ms Valenza establishing care with Dr Silvio Pate.  I have not seen this patient.  She is a former patient of [facility].  Per the record, she had an establish care appointment with me in 08/2015.  She called in for a referral to a chiropractor.  It was explained to her that I had not seen her and was unable to make a referral without her being seen.  It was also explained to her that I normally do not make referrals to chiropractors.  Thanks

## 2015-05-19 NOTE — Telephone Encounter (Signed)
Pt called in wanting to change pcp from Dr Nicki Reaper, to Dr Silvio Pate due to trouble getting referrals.  Is this ok With you? Best number to call pt is home number. Once established, she wants to have a compass referral for chiropractic care. Thanks.

## 2015-05-20 NOTE — Telephone Encounter (Signed)
I feel the same way as Dr Nadara Eaton is going to need to see someone else before a referral is made--I am okay with seeing her but won't make referral till I see her (same as Dr Nicki Reaper)

## 2015-05-25 NOTE — Telephone Encounter (Signed)
appt made 9/28

## 2015-05-25 NOTE — Telephone Encounter (Signed)
Called and lm for pt to call back

## 2015-05-26 NOTE — Telephone Encounter (Signed)
Scheduled for 9/28 for dr Silvio Pate

## 2015-09-15 ENCOUNTER — Encounter: Payer: Self-pay | Admitting: Internal Medicine

## 2015-09-15 ENCOUNTER — Ambulatory Visit (INDEPENDENT_AMBULATORY_CARE_PROVIDER_SITE_OTHER): Payer: 59 | Admitting: Internal Medicine

## 2015-09-15 ENCOUNTER — Encounter: Payer: BC Managed Care – PPO | Admitting: Internal Medicine

## 2015-09-15 ENCOUNTER — Encounter (INDEPENDENT_AMBULATORY_CARE_PROVIDER_SITE_OTHER): Payer: Self-pay

## 2015-09-15 VITALS — BP 154/98 | HR 70 | Temp 97.9°F | Ht 67.0 in | Wt 196.0 lb

## 2015-09-15 DIAGNOSIS — Z0000 Encounter for general adult medical examination without abnormal findings: Secondary | ICD-10-CM | POA: Insufficient documentation

## 2015-09-15 DIAGNOSIS — I10 Essential (primary) hypertension: Secondary | ICD-10-CM

## 2015-09-15 DIAGNOSIS — R0989 Other specified symptoms and signs involving the circulatory and respiratory systems: Secondary | ICD-10-CM | POA: Diagnosis not present

## 2015-09-15 DIAGNOSIS — Z1211 Encounter for screening for malignant neoplasm of colon: Secondary | ICD-10-CM

## 2015-09-15 DIAGNOSIS — J301 Allergic rhinitis due to pollen: Secondary | ICD-10-CM | POA: Insufficient documentation

## 2015-09-15 NOTE — Assessment & Plan Note (Signed)
BP Readings from Last 3 Encounters:  09/15/15 154/98  04/23/15 154/88  03/04/15 167/93   Advised that she needs to take the medication daily Will recheck in 6 weeks

## 2015-09-15 NOTE — Assessment & Plan Note (Signed)
Given age and risk factors--will check ultrasound

## 2015-09-15 NOTE — Progress Notes (Signed)
Subjective:    Patient ID: Courtney Sheppard, female    DOB: 10-11-1961, 54 y.o.   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 cetirizine (ZYRTEC) 10 MG tablet Take 1 tablet (10 mg total) by mouth daily. 30 tablet 1  . fluticasone (FLONASE) 50 MCG/ACT nasal spray Place 1 spray into the nose 2 (two) times daily. 1 g 2  . triamterene-hydrochlorothiazide (MAXZIDE-25) 37.5-25 MG per tablet Take 1 tablet by mouth daily. 30 tablet 5   No current facility-administered medications on file prior to visit.    Allergies  Allergen Reactions  . Aspirin Nausea Only    Past Medical History  Diagnosis Date  . Hypertension     Past Surgical History  Procedure Laterality Date  . Uterine fibroid surgery    . Foot surgery    . Breast biopsy  2014    negative    Family History  Problem Relation Age of Onset  . Hypertension Mother   . Diabetes Mother   . Stroke Mother     3 strokes  . Heart failure Father   . Heart disease Father   . Alcohol abuse Brother   . Alcohol abuse Brother     Social History   Social History  . Marital Status: Married    Spouse Name: N/A  . Number of Children: N/A  . Years of Education: N/A   Occupational History  . aesthetician         Social History Main Topics  . Smoking status: Never Smoker   . Smokeless tobacco: Never Used  . Alcohol Use: No  . Drug Use: No  . Sexual Activity: Yes    Birth Control/ Protection: None   Other Topics Concern  . Not on file   Social History  Narrative   Courtney "Kenney Houseman" grew up in [place]. She is married and has a step daughter. She works as an Engineering geologist.       Hobbies: Golfing, Tennis      Review of Systems  Constitutional:       Has probably lost 7-8# with stress with mom's illness Wears seat belt  HENT: Negative for dental problem, hearing loss and tinnitus.        Overdue for dentist  Eyes: Negative for visual disturbance.       No diplopia or unilateral vision loss  Respiratory: Negative for cough, chest tightness and shortness of breath.   Cardiovascular: Negative for chest pain, palpitations and leg swelling.  Gastrointestinal: Negative for nausea, vomiting, abdominal pain, constipation and blood in stool.       No heartburn  Endocrine: Negative for polydipsia and polyuria.  Genitourinary: Positive for urgency and frequency. Negative for dyspareunia.       Urinary urgency with the diuretic LMP ~1 year ago  Musculoskeletal: Positive for back pain. Negative for joint swelling and arthralgias.  Skin: Negative for rash.       No suspicious lesions  Allergic/Immunologic: Positive for environmental allergies. Negative for immunocompromised  state.  Neurological: Negative for dizziness, syncope, weakness, light-headedness, numbness and headaches.  Hematological: Negative for adenopathy. Does not bruise/bleed easily.  Psychiatric/Behavioral: Negative for sleep disturbance and dysphoric mood. The patient is not nervous/anxious.        Objective:   Physical Exam  Constitutional: She is oriented to person, place, and time. She appears well-developed and well-nourished. No distress.  HENT:  Head: Normocephalic and atraumatic.  Right Ear: External ear normal.  Left Ear: External ear normal.  Mouth/Throat: Oropharynx is clear and moist. No oropharyngeal exudate.  Eyes: Conjunctivae and EOM are normal. Pupils are equal, round, and reactive to light.  Neck: Normal range of motion. Neck supple. No thyromegaly present.    Left carotid bruit  Cardiovascular: Normal rate, regular rhythm, normal heart sounds and intact distal pulses.  Exam reveals no gallop.   No murmur heard. Pulmonary/Chest: Effort normal and breath sounds normal. No respiratory distress. She has no wheezes. She has no rales.  Abdominal: Soft. There is no tenderness.  Musculoskeletal: She exhibits no edema or tenderness.  Lymphadenopathy:    She has no cervical adenopathy.  Neurological: She is alert and oriented to person, place, and time.  Skin: No rash noted. No erythema.  Psychiatric: She has a normal mood and affect. Her behavior is normal.          Assessment & Plan:

## 2015-09-15 NOTE — Progress Notes (Signed)
Pre visit review using our clinic review tool, if applicable. No additional management support is needed unless otherwise documented below in the visit note. 

## 2015-09-15 NOTE — Assessment & Plan Note (Signed)
Due for colonoscopy Will get mammogram at gyn UTD on tetanus Recommended flu vaccine--she will consider

## 2015-09-28 ENCOUNTER — Encounter: Payer: Self-pay | Admitting: Gastroenterology

## 2015-10-07 ENCOUNTER — Ambulatory Visit (INDEPENDENT_AMBULATORY_CARE_PROVIDER_SITE_OTHER): Payer: 59

## 2015-10-07 ENCOUNTER — Other Ambulatory Visit: Payer: Self-pay | Admitting: Internal Medicine

## 2015-10-07 DIAGNOSIS — R0989 Other specified symptoms and signs involving the circulatory and respiratory systems: Secondary | ICD-10-CM

## 2015-10-08 ENCOUNTER — Encounter: Payer: Self-pay | Admitting: *Deleted

## 2015-10-15 ENCOUNTER — Other Ambulatory Visit: Payer: Self-pay | Admitting: Internal Medicine

## 2015-10-27 ENCOUNTER — Ambulatory Visit: Payer: 59 | Admitting: Internal Medicine

## 2015-10-29 ENCOUNTER — Ambulatory Visit: Payer: 59 | Admitting: Internal Medicine

## 2015-11-05 ENCOUNTER — Ambulatory Visit (INDEPENDENT_AMBULATORY_CARE_PROVIDER_SITE_OTHER): Payer: 59 | Admitting: Internal Medicine

## 2015-11-05 ENCOUNTER — Encounter: Payer: Self-pay | Admitting: Internal Medicine

## 2015-11-05 ENCOUNTER — Encounter: Payer: Self-pay | Admitting: *Deleted

## 2015-11-05 VITALS — BP 130/90 | HR 79 | Temp 97.5°F | Wt 198.0 lb

## 2015-11-05 DIAGNOSIS — I10 Essential (primary) hypertension: Secondary | ICD-10-CM | POA: Diagnosis not present

## 2015-11-05 LAB — CBC WITH DIFFERENTIAL/PLATELET
BASOS PCT: 0.4 % (ref 0.0–3.0)
Basophils Absolute: 0 10*3/uL (ref 0.0–0.1)
EOS ABS: 0 10*3/uL (ref 0.0–0.7)
Eosinophils Relative: 1.2 % (ref 0.0–5.0)
HCT: 39.7 % (ref 36.0–46.0)
Hemoglobin: 13.3 g/dL (ref 12.0–15.0)
LYMPHS ABS: 1.4 10*3/uL (ref 0.7–4.0)
Lymphocytes Relative: 36.6 % (ref 12.0–46.0)
MCHC: 33.3 g/dL (ref 30.0–36.0)
MCV: 92.4 fl (ref 78.0–100.0)
MONO ABS: 0.3 10*3/uL (ref 0.1–1.0)
Monocytes Relative: 7.6 % (ref 3.0–12.0)
NEUTROS ABS: 2.1 10*3/uL (ref 1.4–7.7)
NEUTROS PCT: 54.2 % (ref 43.0–77.0)
Platelets: 194 10*3/uL (ref 150.0–400.0)
RBC: 4.3 Mil/uL (ref 3.87–5.11)
RDW: 13.8 % (ref 11.5–15.5)
WBC: 3.9 10*3/uL — AB (ref 4.0–10.5)

## 2015-11-05 LAB — COMPREHENSIVE METABOLIC PANEL
ALT: 10 U/L (ref 0–35)
AST: 15 U/L (ref 0–37)
Albumin: 4.2 g/dL (ref 3.5–5.2)
Alkaline Phosphatase: 76 U/L (ref 39–117)
BUN: 14 mg/dL (ref 6–23)
CHLORIDE: 102 meq/L (ref 96–112)
CO2: 32 meq/L (ref 19–32)
CREATININE: 0.63 mg/dL (ref 0.40–1.20)
Calcium: 9.8 mg/dL (ref 8.4–10.5)
GFR: 127.56 mL/min (ref >60.00)
GLUCOSE: 92 mg/dL (ref 70–99)
Potassium: 3.7 mEq/L (ref 3.5–5.1)
SODIUM: 140 meq/L (ref 135–145)
Total Bilirubin: 0.6 mg/dL (ref 0.2–1.2)
Total Protein: 7.4 g/dL (ref 6.0–8.3)

## 2015-11-05 LAB — T4, FREE: FREE T4: 0.64 ng/dL (ref 0.60–1.60)

## 2015-11-05 NOTE — Assessment & Plan Note (Signed)
BP Readings from Last 3 Encounters:  11/05/15 130/90  09/15/15 154/98  04/23/15 154/88   Better now back on the medication Will check labs

## 2015-11-05 NOTE — Progress Notes (Signed)
Pre visit review using our clinic review tool, if applicable. No additional management support is needed unless otherwise documented below in the visit note. 

## 2015-11-05 NOTE — Progress Notes (Signed)
   Subjective:    Patient ID: Courtney Sheppard, female    DOB: [birth year], 54 y.o.   MRN: [id number]  HPI Here for follow up of HTN  Back to taking the medication regularly No dizziness or orthostatic symptoms No fatigue No edema  Current Outpatient Prescriptions on File Prior to Visit  Medication Sig Dispense Refill  . cetirizine (ZYRTEC) 10 MG tablet Take 1 tablet (10 mg total) by mouth daily. 30 tablet 1  . fluticasone (FLONASE) 50 MCG/ACT nasal spray Place 1 spray into the nose 2 (two) times daily. 1 g 2   No current facility-administered medications on file prior to visit.    Allergies  Allergen Reactions  . Aspirin Nausea Only    Past Medical History  Diagnosis Date  . Hypertension   . Allergic rhinitis due to pollen     Past Surgical History  Procedure Laterality Date  . Uterine fibroid surgery    . Foot surgery    . Breast biopsy  2014    negative    Family History  Problem Relation Age of Onset  . Hypertension Mother   . Diabetes Mother   . Stroke Mother     3 strokes  . Heart failure Father   . Heart disease Father   . Alcohol abuse Brother   . Alcohol abuse Brother     Social History   Social History  . Marital Status: Married    Spouse Name: N/A  . Number of Children: N/A  . Years of Education: N/A   Occupational History  . aesthetician         Social History Main Topics  . Smoking status: Never Smoker   . Smokeless tobacco: Never Used  . Alcohol Use: No  . Drug Use: No  . Sexual Activity: Yes    Birth Control/ Protection: None   Other Topics Concern  . Not on file   Social History Narrative   Courtney "Kenney Houseman" grew up in [place]. She is married and has a step daughter. She works as an Engineering geologist.       Hobbies: Golfing, Tennis      Review of Systems  Voids more frequently Sleeps fine     Objective:   Physical Exam  Constitutional: She appears well-developed and well-nourished. No distress.  Neck: Normal range of  motion. Neck supple. No thyromegaly present.  Cardiovascular: Normal rate, regular rhythm and normal heart sounds.  Exam reveals no gallop.   No murmur heard. Pulmonary/Chest: Effort normal and breath sounds normal. No respiratory distress. She has no wheezes. She has no rales.  Musculoskeletal: She exhibits no edema.  Lymphadenopathy:    She has no cervical adenopathy.  Psychiatric: She has a normal mood and affect. Her behavior is normal.          Assessment & Plan:

## 2015-11-22 ENCOUNTER — Ambulatory Visit (AMBULATORY_SURGERY_CENTER): Payer: Self-pay | Admitting: *Deleted

## 2015-11-22 VITALS — Ht 67.0 in | Wt 199.0 lb

## 2015-11-22 DIAGNOSIS — Z1211 Encounter for screening for malignant neoplasm of colon: Secondary | ICD-10-CM

## 2015-11-22 NOTE — Progress Notes (Signed)
Patient denies any allergies to eggs or soy. Patient denies any problems with anesthesia/sedation. Patient states "hard to wake up after surgeries".  Patient denies any oxygen use at home and does not take any diet/weight loss medications. Patient declined EMMI information today.

## 2015-12-06 ENCOUNTER — Ambulatory Visit (AMBULATORY_SURGERY_CENTER): Payer: 59 | Admitting: Gastroenterology

## 2015-12-06 ENCOUNTER — Encounter: Payer: Self-pay | Admitting: Gastroenterology

## 2015-12-06 VITALS — BP 104/54 | HR 68 | Temp 96.6°F | Resp 13 | Ht 67.0 in | Wt 199.0 lb

## 2015-12-06 DIAGNOSIS — D125 Benign neoplasm of sigmoid colon: Secondary | ICD-10-CM | POA: Diagnosis not present

## 2015-12-06 DIAGNOSIS — Z1211 Encounter for screening for malignant neoplasm of colon: Secondary | ICD-10-CM

## 2015-12-06 DIAGNOSIS — D123 Benign neoplasm of transverse colon: Secondary | ICD-10-CM

## 2015-12-06 HISTORY — PX: COLONOSCOPY: SHX174

## 2015-12-06 MED ORDER — SODIUM CHLORIDE 0.9 % IV SOLN: 500.0000 mL | INTRAVENOUS | Status: DC

## 2015-12-06 NOTE — Op Note (Signed)
[facility]  [address]. [place], [address]   COLONOSCOPY PROCEDURE REPORT  PATIENT: Courtney Sheppard, Courtney Sheppard  MR#: [id number] BIRTHDATE: [birth year] , 17  yrs. old GENDER: female ENDOSCOPIST: Milus Banister, MD REFERRED [id number] Silvio Pate, M.D. PROCEDURE DATE:  12/06/2015 PROCEDURE:   Colonoscopy, screening and Colonoscopy with snare polypectomy First Screening Colonoscopy - Avg.  risk and is 50 yrs.  old or older Yes.  Prior Negative Screening - Now for repeat screening. N/A  History of Adenoma - Now for follow-up colonoscopy & has been > or = to 3 yrs.  N/A  Polyps removed today? Yes ASA CLASS:   Class II INDICATIONS:Screening for colonic neoplasia and Colorectal Neoplasm Risk Assessment for this procedure is average risk. MEDICATIONS: Monitored anesthesia care and Propofol 250 mg IV  DESCRIPTION OF PROCEDURE:   After the risks benefits and alternatives of the procedure were thoroughly explained, informed consent was obtained.  The digital rectal exam revealed no abnormalities of the rectum.   The LB [id number]  endoscope was introduced through the anus and advanced to the cecum, which was identified by both the appendix and ileocecal valve. No adverse events experienced.   The quality of the prep was excellent.  The instrument was then slowly withdrawn as the colon was fully examined. Estimated blood loss is zero unless otherwise noted in this procedure report.   COLON FINDINGS: Three sessile polyps ranging between 3-36mm in size were found in the sigmoid colon and transverse colon. Polypectomies were performed with a cold snare.  The resection was complete, the polyp tissue was completely retrieved and sent to histology.   The examination was otherwise normal.  Retroflexed views revealed no abnormalities. The time to cecum = 4.3 Withdrawal time = 10.2   The scope was withdrawn and the procedure completed. COMPLICATIONS: There were no immediate  complications.  ENDOSCOPIC IMPRESSION: 1.   Three sessile polyps ranging between 3-73mm in size were found in the sigmoid colon and transverse colon; polypectomies were performed with a cold snare 2.   The examination was otherwise normal  RECOMMENDATIONS: If the polyp(s) removed today are proven to be adenomatous (pre-cancerous) polyps, you will need a colonoscopy in 3-5 years. Otherwise you should continue to follow colorectal cancer screening guidelines for "routine risk" patients with a colonoscopy in 10 years.  You will receive a letter within 1-2 weeks with the results of your biopsy as well as final recommendations.  Please call my office if you have not received a letter after 3 weeks.  eSigned:  Milus Banister, MD 12/06/2015 10:59 AM

## 2015-12-06 NOTE — Progress Notes (Signed)
A/ox3 pleased with MAC, report to Karen RN 

## 2015-12-06 NOTE — Progress Notes (Signed)
Called to room to assist during endoscopic procedure.  Patient ID and intended procedure confirmed with present staff. Received instructions for my participation in the procedure from the performing physician.  

## 2015-12-06 NOTE — Patient Instructions (Addendum)
YOU HAD AN ENDOSCOPIC PROCEDURE TODAY AT THE [facility]:   Refer to the procedure report that was given to you for any specific questions about what was found during the examination.  If the procedure report does not answer your questions, please call your gastroenterologist to clarify.  If you requested that your care partner not be given the details of your procedure findings, then the procedure report has been included in a sealed envelope for you to review at your convenience later.  YOU SHOULD EXPECT: Some feelings of bloating in the abdomen. Passage of more gas than usual.  Walking can help get rid of the air that was put into your GI tract during the procedure and reduce the bloating. If you had a lower endoscopy (such as a colonoscopy or flexible sigmoidoscopy) you may notice spotting of blood in your stool or on the toilet paper. If you underwent a bowel prep for your procedure, you may not have a normal bowel movement for a few days.  Please Note:  You might notice some irritation and congestion in your nose or some drainage.  This is from the oxygen used during your procedure.  There is no need for concern and it should clear up in a day or so.  SYMPTOMS TO REPORT IMMEDIATELY:   Following lower endoscopy (colonoscopy or flexible sigmoidoscopy):  Excessive amounts of blood in the stool  Significant tenderness or worsening of abdominal pains  Swelling of the abdomen that is new, acute  Fever of 100F or higher   For urgent or emergent issues, a gastroenterologist can be reached at any hour by calling [phone].   DIET: Your first meal following the procedure should be a small meal and then it is ok to progress to your normal diet. Heavy or fried foods are harder to digest and may make you feel nauseous or bloated.  Likewise, meals heavy in dairy and vegetables can increase bloating.  Drink plenty of fluids but you should avoid alcoholic beverages for 24  hours.  ACTIVITY:  You should plan to take it easy for the rest of today and you should NOT DRIVE or use heavy machinery until tomorrow (because of the sedation medicines used during the test).    FOLLOW UP: Our staff will call the number listed on your records the next business day following your procedure to check on you and address any questions or concerns that you may have regarding the information given to you following your procedure. If we do not reach you, we will leave a message.  However, if you are feeling well and you are not experiencing any problems, there is no need to return our call.  We will assume that you have returned to your regular daily activities without incident.  If any biopsies were taken you will be contacted by phone or by letter within the next 1-3 weeks.  Please call us at [phone] if you have not heard about the biopsies in 3 weeks.    SIGNATURES/CONFIDENTIALITY: You and/or your care partner have signed paperwork which will be entered into your electronic medical record.  These signatures attest to the fact that that the information above on your After Visit Summary has been reviewed and is understood.  Full responsibility of the confidentiality of this discharge information lies with you and/or your care-partner. 

## 2015-12-07 ENCOUNTER — Telehealth: Payer: Self-pay | Admitting: Emergency Medicine

## 2015-12-07 NOTE — Telephone Encounter (Signed)
  Follow up Call-  Call back number 12/06/2015  Post procedure Call Back phone  # [phone] cell  Permission to leave phone message Yes     Patient questions:  Do you have a fever, pain , or abdominal swelling? No. Pain Score  0 *  Have you tolerated food without any problems? Yes.    Have you been able to return to your normal activities? Yes.    Do you have any questions about your discharge instructions: Diet   No. Medications  No. Follow up visit  No.  Do you have questions or concerns about your Care? No.  Actions: * If pain score is 4 or above: No action needed, pain <4.

## 2015-12-10 ENCOUNTER — Encounter: Payer: Self-pay | Admitting: Gastroenterology

## 2016-11-13 ENCOUNTER — Encounter: Payer: 59 | Admitting: Internal Medicine

## 2016-12-19 ENCOUNTER — Telehealth: Payer: Self-pay | Admitting: Internal Medicine

## 2016-12-19 MED ORDER — TRIAMTERENE-HCTZ 37.5-25 MG PO TABS: 1.0000 | ORAL_TABLET | Freq: Every day | ORAL | 0 refills | Status: DC

## 2016-12-19 NOTE — Telephone Encounter (Signed)
Patient left message on triage line needing refill of her BP medication. Sent to [facility] at [facility]. Patient was last seen by Dr. Silvio Pate on 11/05/2015 and upcoming appt. 01/08/2017.  Patient contacted refill will be done for #30 days.

## 2017-01-08 ENCOUNTER — Encounter: Payer: Self-pay | Admitting: Internal Medicine

## 2017-06-01 ENCOUNTER — Ambulatory Visit (INDEPENDENT_AMBULATORY_CARE_PROVIDER_SITE_OTHER): Payer: BLUE CROSS/BLUE SHIELD | Admitting: Internal Medicine

## 2017-06-01 ENCOUNTER — Encounter: Payer: Self-pay | Admitting: Internal Medicine

## 2017-06-01 VITALS — BP 120/90 | HR 78 | Ht 67.0 in | Wt 211.2 lb

## 2017-06-01 DIAGNOSIS — Z0000 Encounter for general adult medical examination without abnormal findings: Secondary | ICD-10-CM | POA: Diagnosis not present

## 2017-06-01 DIAGNOSIS — I10 Essential (primary) hypertension: Secondary | ICD-10-CM

## 2017-06-01 LAB — CBC WITH DIFFERENTIAL/PLATELET
BASOS PCT: 0.8 % (ref 0.0–3.0)
Basophils Absolute: 0 10*3/uL (ref 0.0–0.1)
EOS ABS: 0.1 10*3/uL (ref 0.0–0.7)
EOS PCT: 3 % (ref 0.0–5.0)
HCT: 40 % (ref 36.0–46.0)
Hemoglobin: 13.6 g/dL (ref 12.0–15.0)
LYMPHS ABS: 1.7 10*3/uL (ref 0.7–4.0)
Lymphocytes Relative: 47.3 % — ABNORMAL HIGH (ref 12.0–46.0)
MCHC: 34 g/dL (ref 30.0–36.0)
MCV: 91.7 fl (ref 78.0–100.0)
Monocytes Absolute: 0.2 10*3/uL (ref 0.1–1.0)
Monocytes Relative: 6.3 % (ref 3.0–12.0)
NEUTROS ABS: 1.5 10*3/uL (ref 1.4–7.7)
NEUTROS PCT: 42.6 % — AB (ref 43.0–77.0)
Platelets: 194 10*3/uL (ref 150.0–400.0)
RBC: 4.36 Mil/uL (ref 3.87–5.11)
RDW: 13.6 % (ref 11.5–15.5)
WBC: 3.6 10*3/uL — ABNORMAL LOW (ref 4.0–10.5)

## 2017-06-01 LAB — COMPREHENSIVE METABOLIC PANEL
ALT: 11 U/L (ref 0–35)
AST: 18 U/L (ref 0–37)
Albumin: 4.5 g/dL (ref 3.5–5.2)
Alkaline Phosphatase: 95 U/L (ref 39–117)
BUN: 15 mg/dL (ref 6–23)
CHLORIDE: 103 meq/L (ref 96–112)
CO2: 32 mEq/L (ref 19–32)
Calcium: 10.4 mg/dL (ref 8.4–10.5)
Creatinine, Ser: 0.62 mg/dL (ref 0.40–1.20)
GFR: 129.15 mL/min (ref >60.00)
Glucose, Bld: 103 mg/dL — ABNORMAL HIGH (ref 70–99)
POTASSIUM: 4.2 meq/L (ref 3.5–5.1)
SODIUM: 141 meq/L (ref 135–145)
Total Bilirubin: 0.6 mg/dL (ref 0.2–1.2)
Total Protein: 7.6 g/dL (ref 6.0–8.3)

## 2017-06-01 MED ORDER — TRIAMTERENE-HCTZ 37.5-25 MG PO TABS: 1.0000 | ORAL_TABLET | Freq: Every day | ORAL | 1 refills | Status: DC

## 2017-06-01 NOTE — Assessment & Plan Note (Signed)
BP Readings from Last 3 Encounters:  06/01/17 120/90  12/06/15 (!) 104/54  11/05/15 130/90   Not consistent with meds Discussed lifestyle

## 2017-06-01 NOTE — Patient Instructions (Signed)
DASH Eating Plan DASH stands for "Dietary Approaches to Stop Hypertension." The DASH eating plan is a healthy eating plan that has been shown to reduce high blood pressure (hypertension). It may also reduce your risk for type 2 diabetes, heart disease, and stroke. The DASH eating plan may also help with weight loss. What are tips for following this plan? General guidelines  Avoid eating more than 2,300 mg (milligrams) of salt (sodium) a day. If you have hypertension, you may need to reduce your sodium intake to 1,500 mg a day.  Limit alcohol intake to no more than 1 drink a day for nonpregnant women and 2 drinks a day for men. One drink equals 12 oz of beer, 5 oz of wine, or 1 oz of hard liquor.  Work with your health care provider to maintain a healthy body weight or to lose weight. Ask what an ideal weight is for you.  Get at least 30 minutes of exercise that causes your heart to beat faster (aerobic exercise) most days of the week. Activities may include walking, swimming, or biking.  Work with your health care provider or diet and nutrition specialist (dietitian) to adjust your eating plan to your individual calorie needs. Reading food labels  Check food labels for the amount of sodium per serving. Choose foods with less than 5 percent of the Daily Value of sodium. Generally, foods with less than 300 mg of sodium per serving fit into this eating plan.  To find whole grains, look for the word "whole" as the first word in the ingredient list. Shopping  Buy products labeled as "low-sodium" or "no salt added."  Buy fresh foods. Avoid canned foods and premade or frozen meals. Cooking  Avoid adding salt when cooking. Use salt-free seasonings or herbs instead of table salt or sea salt. Check with your health care provider or pharmacist before using salt substitutes.  Do not fry foods. Cook foods using healthy methods such as baking, boiling, grilling, and broiling instead.  Cook with  heart-healthy oils, such as olive, canola, soybean, or sunflower oil. Meal planning   Eat a balanced diet that includes: ? 5 or more servings of fruits and vegetables each day. At each meal, try to fill half of your plate with fruits and vegetables. ? Up to 6-8 servings of whole grains each day. ? Less than 6 oz of lean meat, poultry, or fish each day. A 3-oz serving of meat is about the same size as a deck of cards. One egg equals 1 oz. ? 2 servings of low-fat dairy each day. ? A serving of nuts, seeds, or beans 5 times each week. ? Heart-healthy fats. Healthy fats called Omega-3 fatty acids are found in foods such as flaxseeds and coldwater fish, like sardines, salmon, and mackerel.  Limit how much you eat of the following: ? Canned or prepackaged foods. ? Food that is high in trans fat, such as fried foods. ? Food that is high in saturated fat, such as fatty meat. ? Sweets, desserts, sugary drinks, and other foods with added sugar. ? Full-fat dairy products.  Do not salt foods before eating.  Try to eat at least 2 vegetarian meals each week.  Eat more home-cooked food and less restaurant, buffet, and fast food.  When eating at a restaurant, ask that your food be prepared with less salt or no salt, if possible. What foods are recommended? The items listed may not be a complete list. Talk with your dietitian about what   dietary choices are best for you. Grains Whole-grain or whole-wheat bread. Whole-grain or whole-wheat pasta. Deandrade rice. Oatmeal. Quinoa. Bulgur. Whole-grain and low-sodium cereals. Pita bread. Low-fat, low-sodium crackers. Whole-wheat flour tortillas. Vegetables Fresh or frozen vegetables (raw, steamed, roasted, or grilled). Low-sodium or reduced-sodium tomato and vegetable juice. Low-sodium or reduced-sodium tomato sauce and tomato paste. Low-sodium or reduced-sodium canned vegetables. Fruits All fresh, dried, or frozen fruit. Canned fruit in natural juice (without  added sugar). Meat and other protein foods Skinless chicken or turkey. Ground chicken or turkey. Pork with fat trimmed off. Fish and seafood. Egg whites. Dried beans, peas, or lentils. Unsalted nuts, nut butters, and seeds. Unsalted canned beans. Lean cuts of beef with fat trimmed off. Low-sodium, lean deli meat. Dairy Low-fat (1%) or fat-free (skim) milk. Fat-free, low-fat, or reduced-fat cheeses. Nonfat, low-sodium ricotta or cottage cheese. Low-fat or nonfat yogurt. Low-fat, low-sodium cheese. Fats and oils Soft margarine without trans fats. Vegetable oil. Low-fat, reduced-fat, or light mayonnaise and salad dressings (reduced-sodium). Canola, safflower, olive, soybean, and sunflower oils. Avocado. Seasoning and other foods Herbs. Spices. Seasoning mixes without salt. Unsalted popcorn and pretzels. Fat-free sweets. What foods are not recommended? The items listed may not be a complete list. Talk with your dietitian about what dietary choices are best for you. Grains Baked goods made with fat, such as croissants, muffins, or some breads. Dry pasta or rice meal packs. Vegetables Creamed or fried vegetables. Vegetables in a cheese sauce. Regular canned vegetables (not low-sodium or reduced-sodium). Regular canned tomato sauce and paste (not low-sodium or reduced-sodium). Regular tomato and vegetable juice (not low-sodium or reduced-sodium). Pickles. Olives. Fruits Canned fruit in a light or heavy syrup. Fried fruit. Fruit in cream or butter sauce. Meat and other protein foods Fatty cuts of meat. Ribs. Fried meat. Bacon. Sausage. Bologna and other processed lunch meats. Salami. Fatback. Hotdogs. Bratwurst. Salted nuts and seeds. Canned beans with added salt. Canned or smoked fish. Whole eggs or egg yolks. Chicken or turkey with skin. Dairy Whole or 2% milk, cream, and half-and-half. Whole or full-fat cream cheese. Whole-fat or sweetened yogurt. Full-fat cheese. Nondairy creamers. Whipped toppings.  Processed cheese and cheese spreads. Fats and oils Butter. Stick margarine. Lard. Shortening. Ghee. Bacon fat. Tropical oils, such as coconut, palm kernel, or palm oil. Seasoning and other foods Salted popcorn and pretzels. Onion salt, garlic salt, seasoned salt, table salt, and sea salt. Worcestershire sauce. Tartar sauce. Barbecue sauce. Teriyaki sauce. Soy sauce, including reduced-sodium. Steak sauce. Canned and packaged gravies. Fish sauce. Oyster sauce. Cocktail sauce. Horseradish that you find on the shelf. Ketchup. Mustard. Meat flavorings and tenderizers. Bouillon cubes. Hot sauce and Tabasco sauce. Premade or packaged marinades. Premade or packaged taco seasonings. Relishes. Regular salad dressings. Where to find more information:  National Heart, Lung, and Blood Institute: www.nhlbi.nih.gov  American Heart Association: www.heart.org Summary  The DASH eating plan is a healthy eating plan that has been shown to reduce high blood pressure (hypertension). It may also reduce your risk for type 2 diabetes, heart disease, and stroke.  With the DASH eating plan, you should limit salt (sodium) intake to 2,300 mg a day. If you have hypertension, you may need to reduce your sodium intake to 1,500 mg a day.  When on the DASH eating plan, aim to eat more fresh fruits and vegetables, whole grains, lean proteins, low-fat dairy, and heart-healthy fats.  Work with your health care provider or diet and nutrition specialist (dietitian) to adjust your eating plan to your individual   calorie needs. This information is not intended to replace advice given to you by your health care provider. Make sure you discuss any questions you have with your health care provider. Document Released: 11/23/2011 Document Revised: 11/27/2016 Document Reviewed: 11/27/2016 Elsevier Interactive Patient Education  2017 Elsevier Inc.  

## 2017-06-01 NOTE — Progress Notes (Signed)
Pre visit review using our clinic review tool, if applicable. No additional management support is needed unless otherwise documented below in the visit note. 

## 2017-06-01 NOTE — Assessment & Plan Note (Signed)
Healthy but needs to work on fitness Keeps up with pap/mammo with gyn Colon due 2019 or 2021 (polyps) DASH info Recommended flu vaccine

## 2017-06-01 NOTE — Progress Notes (Signed)
Subjective:    Patient ID: Courtney Sheppard, female    DOB: 02-20-1961, 56 y.o.   MRN: [id number]  HPI Here for physical  Doing well Both parents in facilities--still caregiver Back to work as aesthethician---self employed Reviewed [facility]  Sees Dr Donneta Romberg Pap done last September Mammogram in their office--does yearly  Bad about taking meds Only takes it about every other day  Current Outpatient Prescriptions on File Prior to Visit  Medication Sig Dispense Refill  . cetirizine (ZYRTEC) 10 MG tablet Take 1 tablet (10 mg total) by mouth daily. 30 tablet 1  . fluticasone (FLONASE) 50 MCG/ACT nasal spray Place 1 spray into the nose 2 (two) times daily. 1 g 2   No current facility-administered medications on file prior to visit.     Allergies  Allergen Reactions  . Aspirin Nausea Only  . No Known Allergies     Past Medical History:  Diagnosis Date  . Allergic rhinitis due to pollen   . Hypertension     Past Surgical History:  Procedure Laterality Date  . BREAST BIOPSY  2014   negative  . FOOT SURGERY    . UTERINE FIBROID SURGERY      Family History  Problem Relation Age of Onset  . Hypertension Mother   . Diabetes Mother   . Stroke Mother        3 strokes  . Heart failure Father   . Heart disease Father   . Alcohol abuse Brother   . Alcohol abuse Brother   . Breast cancer Cousin   . Colon cancer Neg Hx   . Esophageal cancer Neg Hx   . Rectal cancer Neg Hx   . Stomach cancer Neg Hx     Social History   Social History  . Marital status: Married    Spouse name: N/A  . Number of children: N/A  . Years of education: N/A   Occupational History  . Aesthetician     self employed   Social History Main Topics  . Smoking status: Never Smoker  . Smokeless tobacco: Never Used  . Alcohol use No  . Drug use: No  . Sexual activity: Yes    Birth control/ protection: None   Other Topics Concern  . Not on file   Social History Narrative   Courtney "Kenney Houseman"  grew up in [place]. She is married and has a step daughter. She works as an Engineering geologist.       Hobbies: Golfing, Tennis      Review of Systems  Constitutional:       Joined gym but not going much--trying to increase Weight up some Using [employer]itness[employer] Wears seat belt  HENT: Negative for dental problem, hearing loss and tinnitus.        Overdue for dentist  Eyes: Negative for visual disturbance.       No diplopia or unilateral vision loss   Respiratory: Positive for cough. Negative for chest tightness and shortness of breath.   Cardiovascular: Negative for chest pain, palpitations and leg swelling.  Gastrointestinal: Negative for abdominal pain, blood in stool and constipation.       Nb heartburn  Endocrine: Negative for polydipsia and polyuria.  Genitourinary: Negative for dyspareunia, dysuria and hematuria.  Musculoskeletal: Positive for back pain. Negative for arthralgias and joint swelling.       Back pain--relates to position when sitting  Skin: Negative for rash.       No suspicious lesions  Allergic/Immunologic: Positive for environmental  allergies. Negative for immunocompromised state.       Will get drainage and cough-- bad about taking meds  Neurological: Negative for dizziness, syncope, light-headedness and headaches.  Psychiatric/Behavioral: Negative for dysphoric mood. The patient is not nervous/anxious.        Snores at night---no apnea       Objective:   Physical Exam  Constitutional: She is oriented to person, place, and time. She appears well-developed and well-nourished. No distress.  HENT:  Head: Normocephalic and atraumatic.  Right Ear: External ear normal.  Left Ear: External ear normal.  Mouth/Throat: Oropharynx is clear and moist. No oropharyngeal exudate.  Eyes: Conjunctivae are normal. Pupils are equal, round, and reactive to light.  Neck: Normal range of motion. Neck supple. No thyromegaly present.  Cardiovascular: Normal rate, regular rhythm,  normal heart sounds and intact distal pulses.  Exam reveals no gallop.   No murmur heard. Pulmonary/Chest: Effort normal and breath sounds normal. No respiratory distress. She has no wheezes. She has no rales.  Abdominal: Soft. There is no tenderness.  Musculoskeletal: She exhibits no edema or tenderness.  Lymphadenopathy:    She has no cervical adenopathy.  Neurological: She is alert and oriented to person, place, and time.  Skin: No rash noted. No erythema.  Psychiatric: She has a normal mood and affect. Her behavior is normal.          Assessment & Plan:

## 2017-06-04 ENCOUNTER — Encounter: Payer: Self-pay | Admitting: *Deleted

## 2018-01-05 LAB — HM MAMMOGRAPHY

## 2018-01-11 ENCOUNTER — Encounter: Payer: Self-pay | Admitting: Internal Medicine

## 2018-02-11 ENCOUNTER — Encounter: Payer: Self-pay | Admitting: Family Medicine

## 2018-02-11 ENCOUNTER — Ambulatory Visit: Payer: BLUE CROSS/BLUE SHIELD | Admitting: Family Medicine

## 2018-02-11 VITALS — BP 162/98 | HR 86 | Temp 98.3°F | Wt 229.0 lb

## 2018-02-11 DIAGNOSIS — B9789 Other viral agents as the cause of diseases classified elsewhere: Secondary | ICD-10-CM

## 2018-02-11 DIAGNOSIS — J069 Acute upper respiratory infection, unspecified: Secondary | ICD-10-CM | POA: Diagnosis not present

## 2018-02-11 DIAGNOSIS — I10 Essential (primary) hypertension: Secondary | ICD-10-CM

## 2018-02-11 MED ORDER — FLUTICASONE PROPIONATE 50 MCG/ACT NA SUSP: 2.0000 | Freq: Every day | NASAL | 6 refills | Status: DC

## 2018-02-11 MED ORDER — BENZONATATE 100 MG PO CAPS: 100.0000 mg | ORAL_CAPSULE | Freq: Three times a day (TID) | ORAL | 0 refills | Status: DC | PRN

## 2018-02-11 NOTE — Progress Notes (Signed)
Subjective:    Patient ID: Courtney Sheppard, female    DOB: 1960-12-26, 57 y.o.   MRN: [id number]  HPI This is a 57 yo female who presents today with cough and nasal congestion x 6 days. Started with sore throat, cough, nasal drainage. Has been taking Dayquil, allergy medicine, Elderberry tea, "Severity" medicine. No fever, little production with cough. Has a lot of post nasal drainage and watery nasal drainage. No headache or sinus pressure. No wheeze or SOB, no ear pain.  Has not taken flonase in years.  Has not been taking blood pressure medicine regularly while sick.  She works as an Engineering geologist.    Past Medical History:  Diagnosis Date  . Allergic rhinitis due to pollen   . Hypertension    Past Surgical History:  Procedure Laterality Date  . BREAST BIOPSY  2014   negative  . FOOT SURGERY    . UTERINE FIBROID SURGERY     Family History  Problem Relation Age of Onset  . Hypertension Mother   . Diabetes Mother   . Stroke Mother        3 strokes  . Heart failure Father   . Heart disease Father   . Alcohol abuse Brother   . Alcohol abuse Brother   . Breast cancer Cousin   . Colon cancer Neg Hx   . Esophageal cancer Neg Hx   . Rectal cancer Neg Hx   . Stomach cancer Neg Hx    Social History   Tobacco Use  . Smoking status: Never Smoker  . Smokeless tobacco: Never Used  Substance Use Topics  . Alcohol use: No    Alcohol/week: 0.0 oz  . Drug use: No      Review of Systems Per HPI    Objective:   Physical Exam  Constitutional: She is oriented to person, place, and time. She appears well-developed and well-nourished. No distress.  HENT:  Head: Normocephalic and atraumatic.  Right Ear: Tympanic membrane, external ear and ear canal normal.  Left Ear: Tympanic membrane, external ear and ear canal normal.  Nose: Mucosal edema and rhinorrhea present.  Mouth/Throat: Uvula is midline and oropharynx is clear and moist.  Eyes: Conjunctivae are normal.  Neck: Normal  range of motion. Neck supple.  Cardiovascular: Normal rate, regular rhythm and normal heart sounds.  Pulmonary/Chest: Effort normal and breath sounds normal.  Lymphadenopathy:    She has no cervical adenopathy.  Neurological: She is alert and oriented to person, place, and time.  Skin: Skin is warm and dry. She is not diaphoretic.  Psychiatric: She has a normal mood and affect. Her behavior is normal. Judgment and thought content normal.  Vitals reviewed.     BP (!) 162/98   Pulse 86   Temp 98.3 F (36.8 C) (Oral)   Wt 229 lb (103.9 kg)   LMP 10/18/2014   SpO2 98%   BMI 35.87 kg/m  Wt Readings from Last 3 Encounters:  02/11/18 229 lb (103.9 kg)  06/01/17 211 lb 4 oz (95.8 kg)  12/06/15 199 lb (90.3 kg)   BP Readings from Last 3 Encounters:  02/11/18 (!) 162/98  06/01/17 120/90  12/06/15 (!) 104/54       Assessment & Plan:  1. Viral URI with cough -  Patient Instructions  For nasal congestion you can use Afrin nasal spray for 3 days max, saline nasal spray (generic is fine for all). Use your flonase and cetirizine daily, can add benadryl at bedtime if  needed Drink enough fluids to make your urine light yellow. For fever/chill/muscle aches you can take over the counter acetaminophen or ibuprofen.  Please come back in if you are not better in 5-7 days or if you develop wheezing, shortness of breath or persistent vomiting.     - benzonatate (TESSALON) 100 MG capsule; Take 1-2 capsules (100-200 mg total) by mouth 3 (three) times daily as needed for cough.  Dispense: 40 capsule; Refill: 0 - fluticasone (FLONASE) 50 MCG/ACT nasal spray; Place 2 sprays into both nostrils daily.  Dispense: 16 g; Refill: 6  2. Essential hypertension - blood pressure elevated today, has not had medication in several days - encouraged her to take her medication daily and if she has questions about interactions with OTC, to call office or ask pharmacist - follow up with PCP   Clarene Reamer, FNP-BC  [facility]  02/11/2018 9:00 AM

## 2018-02-11 NOTE — Patient Instructions (Signed)
For nasal congestion you can use Afrin nasal spray for 3 days max, saline nasal spray (generic is fine for all). Use your flonase and cetirizine daily, can add benadryl at bedtime if needed Drink enough fluids to make your urine light yellow. For fever/chill/muscle aches you can take over the counter acetaminophen or ibuprofen.  Please come back in if you are not better in 5-7 days or if you develop wheezing, shortness of breath or persistent vomiting.

## 2018-07-09 ENCOUNTER — Telehealth: Payer: Self-pay | Admitting: *Deleted

## 2018-07-09 NOTE — Telephone Encounter (Signed)
Copied from [place] [id number]. Topic: Referral - Request >> Jul 09, 2018 11:41 AM Bea Graff, NT wrote: Reason for CRM: Pt would like a referral to a orthopedic doctor for her left ankle that she hurt in June.

## 2018-07-09 NOTE — Telephone Encounter (Signed)
Dr. Silvio Pate is out of the office this week. She needs to make an appt to be seen, referral can be placed at that time if deemed necessary.

## 2018-07-11 NOTE — Telephone Encounter (Signed)
FYI to [employer]

## 2018-07-11 NOTE — Telephone Encounter (Signed)
Pt states she will call the orthopedic doctor to set up an appointment.

## 2018-07-11 NOTE — Telephone Encounter (Signed)
Left message for pt to call the office.   If her insurance requires a referral we would need to see her. Otherwise. She should be able to schedule her own appt with an orthopedic specialist.

## 2018-07-19 ENCOUNTER — Other Ambulatory Visit: Payer: Self-pay | Admitting: Internal Medicine

## 2018-08-06 ENCOUNTER — Ambulatory Visit: Payer: BLUE CROSS/BLUE SHIELD | Admitting: Internal Medicine

## 2018-08-21 ENCOUNTER — Ambulatory Visit (INDEPENDENT_AMBULATORY_CARE_PROVIDER_SITE_OTHER): Payer: BLUE CROSS/BLUE SHIELD | Admitting: Internal Medicine

## 2018-08-21 ENCOUNTER — Encounter: Payer: Self-pay | Admitting: Internal Medicine

## 2018-08-21 VITALS — BP 140/84 | HR 69 | Temp 98.1°F | Ht 67.0 in | Wt 225.0 lb

## 2018-08-21 DIAGNOSIS — I10 Essential (primary) hypertension: Secondary | ICD-10-CM | POA: Diagnosis not present

## 2018-08-21 DIAGNOSIS — Z23 Encounter for immunization: Secondary | ICD-10-CM

## 2018-08-21 DIAGNOSIS — Z0000 Encounter for general adult medical examination without abnormal findings: Secondary | ICD-10-CM | POA: Diagnosis not present

## 2018-08-21 LAB — COMPREHENSIVE METABOLIC PANEL
ALT: 16 U/L (ref 0–35)
AST: 19 U/L (ref 0–37)
Albumin: 4.6 g/dL (ref 3.5–5.2)
Alkaline Phosphatase: 85 U/L (ref 39–117)
BUN: 21 mg/dL (ref 6–23)
CALCIUM: 10.1 mg/dL (ref 8.4–10.5)
CHLORIDE: 100 meq/L (ref 96–112)
CO2: 32 meq/L (ref 19–32)
Creatinine, Ser: 0.78 mg/dL (ref 0.40–1.20)
GFR: 98.64 mL/min (ref >60.00)
GLUCOSE: 107 mg/dL — AB (ref 70–99)
Potassium: 3.8 mEq/L (ref 3.5–5.1)
Sodium: 139 mEq/L (ref 135–145)
Total Bilirubin: 0.6 mg/dL (ref 0.2–1.2)
Total Protein: 7.9 g/dL (ref 6.0–8.3)

## 2018-08-21 LAB — CBC
HEMATOCRIT: 38.9 % (ref 36.0–46.0)
Hemoglobin: 13.2 g/dL (ref 12.0–15.0)
MCHC: 34.1 g/dL (ref 30.0–36.0)
MCV: 90.3 fl (ref 78.0–100.0)
PLATELETS: 204 10*3/uL (ref 150.0–400.0)
RBC: 4.3 Mil/uL (ref 3.87–5.11)
RDW: 13.7 % (ref 11.5–15.5)
WBC: 4.7 10*3/uL (ref 4.0–10.5)

## 2018-08-21 NOTE — Progress Notes (Signed)
Subjective:    Patient ID: Courtney Sheppard, female    DOB: November 30, 1961, 57 y.o.   MRN: [id number]  HPI Here for physical  Has gained some more weight since last year She skips meals--then eats a big meal Has started working out at gym and starting tennis lessons  No problems with BP Tolerates the medication fine  Current Outpatient Medications on File Prior to Visit  Medication Sig Dispense Refill  . amoxicillin (AMOXIL) 500 MG capsule TAKE ONE CAPSULE 3 TIMES A DAY UNTIL COMPLETE  0  . BIOTIN PO Take by mouth.    . cetirizine (ZYRTEC) 10 MG tablet Take 10 mg by mouth daily.    . fluticasone (FLONASE) 50 MCG/ACT nasal spray Place 2 sprays into both nostrils daily. 16 g 6  . ibuprofen (ADVIL,MOTRIN) 800 MG tablet TAKE 1 TABLET EVERY 6-8 HOURS AS NEEDED  0  . Multiple Vitamin (MULTIVITAMIN) tablet Take 1 tablet by mouth daily.    Marland Kitchen triamterene-hydrochlorothiazide (MAXZIDE-25) 37.5-25 MG tablet Take 1 tablet by mouth daily. NEEDS OFFICE VISIT 90 tablet 0   No current facility-administered medications on file prior to visit.     Allergies  Allergen Reactions  . Aspirin Nausea Only  . No Known Allergies     Past Medical History:  Diagnosis Date  . Allergic rhinitis due to pollen   . Hypertension     Past Surgical History:  Procedure Laterality Date  . BREAST BIOPSY  2014   negative  . FOOT SURGERY    . UTERINE FIBROID SURGERY      Family History  Problem Relation Age of Onset  . Hypertension Mother   . Diabetes Mother   . Stroke Mother        3 strokes  . Heart failure Father   . Heart disease Father   . Alcohol abuse Brother   . Alcohol abuse Brother   . Breast cancer Cousin   . Colon cancer Neg Hx   . Esophageal cancer Neg Hx   . Rectal cancer Neg Hx   . Stomach cancer Neg Hx     Social History   Socioeconomic History  . Marital status: Married    Spouse name: Not on file  . Number of children: Not on file  . Years of education: Not on file  .  Highest education level: Not on file  Occupational History  . Occupation: Aesthetician    Comment: self employed  Social Needs  . Financial resource strain: Not on file  . Food insecurity:    Worry: Not on file    Inability: Not on file  . Transportation needs:    Medical: Not on file    Non-medical: Not on file  Tobacco Use  . Smoking status: Never Smoker  . Smokeless tobacco: Never Used  Substance and Sexual Activity  . Alcohol use: No    Alcohol/week: 0.0 standard drinks  . Drug use: No  . Sexual activity: Yes    Birth control/protection: None  Lifestyle  . Physical activity:    Days per week: Not on file    Minutes per session: Not on file  . Stress: Not on file  Relationships  . Social connections:    Talks on phone: Not on file    Gets together: Not on file    Attends religious service: Not on file    Active member of club or organization: Not on file    Attends meetings of clubs or organizations: Not  on file    Relationship status: Not on file  . Intimate partner violence:    Fear of current or ex partner: Not on file    Emotionally abused: Not on file    Physically abused: Not on file    Forced sexual activity: Not on file  Other Topics Concern  . Not on file  Social History Narrative   Courtney "Kenney Houseman" grew up in [place]. She is married and has a step daughter. She works as an Engineering geologist.       Hobbies: Golfing, Tennis   Review of Systems  Constitutional: Negative for fatigue.       Wears seat  belt  HENT: Negative for hearing loss, tinnitus and trouble swallowing.        On amoxil in preparation for root canal  Eyes: Negative for visual disturbance.       No diplopia or unilateral vision loss  Respiratory: Negative for cough, chest tightness and shortness of breath.   Cardiovascular: Negative for chest pain, palpitations and leg swelling.  Gastrointestinal: Negative for blood in stool and constipation.       No heartburn   Endocrine: Negative for  polydipsia and polyuria.  Genitourinary: Negative for dysuria and hematuria.       Keeps up with gyn--Dr Garwin Brothers No sex--no problem  Musculoskeletal: Negative for arthralgias, back pain and joint swelling.  Skin: Negative for rash.  Allergic/Immunologic: Positive for environmental allergies. Negative for immunocompromised state.  Neurological: Negative for dizziness, syncope, light-headedness and headaches.  Hematological: Negative for adenopathy. Does not bruise/bleed easily.  Psychiatric/Behavioral: Negative for dysphoric mood. The patient is not nervous/anxious.        Still snores Using peppermint oil in nose at night--may be helping No apnea No daytime somnolence       Objective:   Physical Exam  Constitutional: She is oriented to person, place, and time. She appears well-developed. No distress.  HENT:  Head: Normocephalic and atraumatic.  Right Ear: External ear normal.  Left Ear: External ear normal.  Mouth/Throat: Oropharynx is clear and moist. No oropharyngeal exudate.  Eyes: Pupils are equal, round, and reactive to light. Conjunctivae are normal.  Neck: No thyromegaly present.  Cardiovascular: Normal rate, regular rhythm, normal heart sounds and intact distal pulses. Exam reveals no gallop.  No murmur heard. Respiratory: Effort normal and breath sounds normal. No respiratory distress. She has no wheezes. She has no rales.  GI: Soft. There is no tenderness.  Musculoskeletal: She exhibits no edema or tenderness.  Lymphadenopathy:    She has no cervical adenopathy.  Neurological: She is alert and oriented to person, place, and time.  Skin: No rash noted. No erythema.  Psychiatric: She has a normal mood and affect. Her behavior is normal.           Assessment & Plan:

## 2018-08-21 NOTE — Addendum Note (Signed)
Addended by: Pilar Grammes on: 08/21/2018 11:25 AM   Modules accepted: Orders

## 2018-08-21 NOTE — Assessment & Plan Note (Signed)
BP Readings from Last 3 Encounters:  08/21/18 140/84  02/11/18 (!) 162/98  06/01/17 120/90   Acceptable control Continue the medication Will check labs

## 2018-08-21 NOTE — Assessment & Plan Note (Signed)
Healthy but still issues with weight Discussed healthy lifestyle Recommended flu vaccine--but prefers not Update Td today Colon due 2021 mammo and pap per gyn

## 2018-12-08 ENCOUNTER — Encounter: Payer: Self-pay | Admitting: Gastroenterology

## 2019-02-01 ENCOUNTER — Other Ambulatory Visit: Payer: Self-pay | Admitting: Internal Medicine

## 2019-02-03 ENCOUNTER — Ambulatory Visit: Payer: Self-pay | Admitting: Podiatrist

## 2019-08-19 LAB — HM MAMMOGRAPHY

## 2019-08-19 LAB — HM PAP SMEAR: HM Pap smear: NEGATIVE

## 2019-08-26 ENCOUNTER — Encounter: Payer: Self-pay | Admitting: Internal Medicine

## 2019-08-26 ENCOUNTER — Ambulatory Visit (INDEPENDENT_AMBULATORY_CARE_PROVIDER_SITE_OTHER): Payer: BLUE CROSS/BLUE SHIELD | Admitting: Internal Medicine

## 2019-08-26 ENCOUNTER — Other Ambulatory Visit: Payer: Self-pay

## 2019-08-26 VITALS — BP 142/78 | HR 70 | Temp 97.2°F | Ht 67.0 in | Wt 220.0 lb

## 2019-08-26 DIAGNOSIS — Z1211 Encounter for screening for malignant neoplasm of colon: Secondary | ICD-10-CM

## 2019-08-26 DIAGNOSIS — Z0000 Encounter for general adult medical examination without abnormal findings: Secondary | ICD-10-CM | POA: Diagnosis not present

## 2019-08-26 DIAGNOSIS — I10 Essential (primary) hypertension: Secondary | ICD-10-CM

## 2019-08-26 NOTE — Assessment & Plan Note (Signed)
BP Readings from Last 3 Encounters:  08/26/19 (!) 142/78  08/21/18 140/84  02/11/18 (!) 162/98   Generally good at home Will check labs

## 2019-08-26 NOTE — Assessment & Plan Note (Signed)
Healthy Working on fitness again, mostly plant based diet---and has lost some weight Doesn't want flu vaccine Yearly mammogram--due now Overdue for colon--will set up within her plan Pap per gyn

## 2019-08-26 NOTE — Progress Notes (Signed)
Subjective:    Patient ID: Courtney Sheppard, female    DOB: 1961/11/25, 58 y.o.   MRN: [id number]  HPI Virtual visit for annual preventative exam Identification done Reviewed billing and she gave consent She is at home and I am in my office  She did go back to work but limited No new health concerns Now has a trainer---twice a week  Does check BP at times Part time on plant based diet 130-148/80's  Current Outpatient Medications on File Prior to Visit  Medication Sig Dispense Refill  . BIOTIN PO Take by mouth.    . cetirizine (ZYRTEC) 10 MG tablet Take 10 mg by mouth daily.    . fluticasone (FLONASE) 50 MCG/ACT nasal spray Place 2 sprays into both nostrils daily. 16 g 6  . ibuprofen (ADVIL,MOTRIN) 800 MG tablet TAKE 1 TABLET EVERY 6-8 HOURS AS NEEDED  0  . Multiple Vitamin (MULTIVITAMIN) tablet Take 1 tablet by mouth daily.    Marland Kitchen triamterene-hydrochlorothiazide (MAXZIDE-25) 37.5-25 MG tablet TAKE 1 TABLET BY MOUTH EVERY DAY 30 tablet 11   No current facility-administered medications on file prior to visit.     Allergies  Allergen Reactions  . Aspirin Nausea Only  . No Known Allergies     Past Medical History:  Diagnosis Date  . Allergic rhinitis due to pollen   . Hypertension     Past Surgical History:  Procedure Laterality Date  . BREAST BIOPSY  2014   negative  . FOOT SURGERY    . UTERINE FIBROID SURGERY      Family History  Problem Relation Age of Onset  . Hypertension Mother   . Diabetes Mother   . Stroke Mother        3 strokes  . Breast cancer Mother   . Heart failure Father   . Heart disease Father   . Alcohol abuse Brother   . Alcohol abuse Brother   . Breast cancer Cousin   . Colon cancer Neg Hx   . Esophageal cancer Neg Hx   . Rectal cancer Neg Hx   . Stomach cancer Neg Hx     Social History   Socioeconomic History  . Marital status: Married    Spouse name: Not on file  . Number of children: Not on file  . Years of education: Not on  file  . Highest education level: Not on file  Occupational History  . Occupation: Aesthetician    Comment: self employed  Social Needs  . Financial resource strain: Not on file  . Food insecurity    Worry: Not on file    Inability: Not on file  . Transportation needs    Medical: Not on file    Non-medical: Not on file  Tobacco Use  . Smoking status: Never Smoker  . Smokeless tobacco: Never Used  Substance and Sexual Activity  . Alcohol use: No    Alcohol/week: 0.0 standard drinks  . Drug use: No  . Sexual activity: Yes    Birth control/protection: None  Lifestyle  . Physical activity    Days per week: Not on file    Minutes per session: Not on file  . Stress: Not on file  Relationships  . Social Herbalist on phone: Not on file    Gets together: Not on file    Attends religious service: Not on file    Active member of club or organization: Not on file    Attends meetings of  clubs or organizations: Not on file    Relationship status: Not on file  . Intimate partner violence    Fear of current or ex partner: Not on file    Emotionally abused: Not on file    Physically abused: Not on file    Forced sexual activity: Not on file  Other Topics Concern  . Not on file  Social History Narrative   Daijia "Kenney Houseman" grew up in [place]. She is married and has a step daughter. She works as an Engineering geologist.       Hobbies: Golfing, Tennis   Review of Systems  Constitutional: Negative for fatigue.       Has lost some weight Wears seat belt  HENT: Negative for hearing loss and tinnitus.        Keeps up with dentist--lots of recent work  Eyes: Negative for visual disturbance.       No diplopia or unilateral vision  Respiratory: Negative for cough, chest tightness and shortness of breath.   Cardiovascular: Negative for chest pain, palpitations and leg swelling.  Gastrointestinal: Negative for abdominal pain, blood in stool and constipation.       No heartburn   Endocrine: Negative for polydipsia and polyuria.  Genitourinary: Negative for difficulty urinating, dyspareunia, dysuria and hematuria.       Keeps up with gyn  Musculoskeletal: Negative for arthralgias, back pain and joint swelling.       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  Allergic/Immunologic: Positive for environmental allergies. Negative for immunocompromised state.       Uses OTC meds prn  Neurological: Negative for dizziness, syncope, light-headedness and headaches.  Hematological: Negative for adenopathy. Does not bruise/bleed easily.  Psychiatric/Behavioral: Negative for dysphoric mood and sleep disturbance. The patient is not nervous/anxious.        Objective:   Physical Exam  Constitutional: She appears well-developed. No distress.  Respiratory: Effort normal. No respiratory distress.  Psychiatric: She has a normal mood and affect. Her behavior is normal.           Assessment & Plan:

## 2019-08-28 ENCOUNTER — Other Ambulatory Visit (INDEPENDENT_AMBULATORY_CARE_PROVIDER_SITE_OTHER): Payer: BLUE CROSS/BLUE SHIELD

## 2019-08-28 DIAGNOSIS — I10 Essential (primary) hypertension: Secondary | ICD-10-CM | POA: Diagnosis not present

## 2019-08-29 LAB — COMPREHENSIVE METABOLIC PANEL
ALT: 9 IU/L (ref 0–32)
AST: 14 IU/L (ref 0–40)
Albumin/Globulin Ratio: 1.6 (ref 1.2–2.2)
Albumin: 4.3 g/dL (ref 3.8–4.9)
Alkaline Phosphatase: 88 IU/L (ref 39–117)
BUN/Creatinine Ratio: 29 — ABNORMAL HIGH (ref 9–23)
BUN: 16 mg/dL (ref 6–24)
Bilirubin Total: 0.5 mg/dL (ref 0.0–1.2)
CO2: 25 mmol/L (ref 20–29)
Calcium: 9.6 mg/dL (ref 8.7–10.2)
Chloride: 103 mmol/L (ref 96–106)
Creatinine, Ser: 0.56 mg/dL — ABNORMAL LOW (ref 0.57–1.00)
GFR calc Af Amer: 121 mL/min/{1.73_m2} (ref >59)
GFR calc non Af Amer: 105 mL/min/{1.73_m2} (ref >59)
Globulin, Total: 2.7 g/dL (ref 1.5–4.5)
Glucose: 95 mg/dL (ref 65–99)
Potassium: 4.2 mmol/L (ref 3.5–5.2)
Sodium: 142 mmol/L (ref 134–144)
Total Protein: 7 g/dL (ref 6.0–8.5)

## 2019-08-29 LAB — LIPID PANEL
Chol/HDL Ratio: 3.6 ratio (ref 0.0–4.4)
Cholesterol, Total: 198 mg/dL (ref 100–199)
HDL: 55 mg/dL (ref >39)
LDL Chol Calc (NIH): 130 mg/dL — ABNORMAL HIGH (ref 0–99)
Triglycerides: 74 mg/dL (ref 0–149)
VLDL Cholesterol Cal: 13 mg/dL (ref 5–40)

## 2019-08-29 LAB — CBC
Hematocrit: 37.7 % (ref 34.0–46.6)
Hemoglobin: 13 g/dL (ref 11.1–15.9)
MCH: 30.6 pg (ref 26.6–33.0)
MCHC: 34.5 g/dL (ref 31.5–35.7)
MCV: 89 fL (ref 79–97)
Platelets: 182 10*3/uL (ref 150–450)
RBC: 4.25 x10E6/uL (ref 3.77–5.28)
RDW: 13.3 % (ref 11.7–15.4)
WBC: 4 10*3/uL (ref 3.4–10.8)

## 2019-10-02 ENCOUNTER — Telehealth: Payer: Self-pay | Admitting: Internal Medicine

## 2019-10-02 NOTE — Telephone Encounter (Signed)
Left message to call office

## 2019-10-02 NOTE — Telephone Encounter (Signed)
Best number [phone]  Pt scheduled flu shot.  She stated she has never had one before and has questions.

## 2019-10-09 ENCOUNTER — Ambulatory Visit: Payer: BLUE CROSS/BLUE SHIELD

## 2019-10-09 ENCOUNTER — Telehealth: Payer: Self-pay | Admitting: Internal Medicine

## 2019-10-09 ENCOUNTER — Other Ambulatory Visit: Payer: Self-pay

## 2019-10-09 NOTE — Telephone Encounter (Signed)
Patient called back, advised her of message below. Patient stated that she has never been told this. She cancelled appointment for flu shot today

## 2019-10-09 NOTE — Telephone Encounter (Signed)
Left message asking pt to call office Please verify pt insurance   [facility].  If so please let pt know we are out of network with this insurance.  We can still see  Her just make her aware we are out of network

## 2019-11-26 DIAGNOSIS — M1711 Unilateral primary osteoarthritis, right knee: Secondary | ICD-10-CM | POA: Insufficient documentation

## 2020-01-07 DIAGNOSIS — M25561 Pain in right knee: Secondary | ICD-10-CM | POA: Insufficient documentation

## 2020-03-29 ENCOUNTER — Ambulatory Visit: Payer: Self-pay | Admitting: Orthopedic Surgery

## 2020-05-07 NOTE — Patient Instructions (Addendum)
DUE TO COVID-19 ONLY ONE VISITOR IS ALLOWED TO COME WITH YOU AND STAY IN THE WAITING ROOM ONLY DURING PRE OP AND PROCEDURE DAY OF SURGERY. THE 1 VISITOR MAY VISIT WITH YOU AFTER SURGERY IN YOUR PRIVATE ROOM DURING VISITING HOURS ONLY!  YOU NEED TO HAVE A COVID 19 TEST ON: 05/15/20@ 12:20 pm , THIS TEST MUST BE DONE BEFORE SURGERY, COME  [place], [address] , [id number].  ([place]) ONCE YOUR COVID TEST IS COMPLETED, PLEASE BEGIN THE QUARANTINE INSTRUCTIONS AS OUTLINED IN YOUR HANDOUT.                Balinda Quails   Your procedure is scheduled on: 05/19/20   Report to [facility] Main  Entrance   Report to admitting at: 6:30 AM     Call this number if you have problems the morning of surgery [phone]    Remember:   NO SOLID FOOD AFTER MIDNIGHT THE NIGHT PRIOR TO SURGERY. NOTHING BY MOUTH EXCEPT CLEAR LIQUIDS UNTIL: 5:30 am . PLEASE FINISH ENSURE DRINK PER SURGEON ORDER  WHICH NEEDS TO BE COMPLETED AT: 5:30 am .   CLEAR LIQUID DIET   Foods Allowed                                                                     Foods Excluded  Coffee and tea, regular and decaf                             liquids that you cannot  Plain Jell-O any favor except red or purple                                           see through such as: Fruit ices (not with fruit pulp)                                     milk, soups, orange juice  Iced Popsicles                                    All solid food Carbonated beverages, regular and diet                                    Cranberry, grape and apple juices Sports drinks like Gatorade Lightly seasoned clear broth or consume(fat free) Sugar, honey syrup  Sample Menu Breakfast                                Lunch                                     Supper Cranberry juice  Beef broth                            Chicken broth Jell-O                                     Grape juice                            Apple juice Coffee or tea                        Jell-O                                      Popsicle                                                Coffee or tea                        Coffee or tea  _____________________________________________________________________  BRUSH YOUR TEETH MORNING OF SURGERY AND RINSE YOUR MOUTH OUT, NO CHEWING GUM CANDY OR MINTS.     Take these medicines the morning of surgery with A SIP OF WATER: cetirizine.                                 You may not have any metal on your body including hair pins and              piercings  Do not wear jewelry, make-up, lotions, powders or perfumes, deodorant             Do not wear nail polish on your fingernails.  Do not shave  48 hours prior to surgery.            .   Do not bring valuables to the hospital. [place].  Contacts, dentures or bridgework may not be worn into surgery.  Leave suitcase in the car. After surgery it may be brought to your room.     Patients discharged the day of surgery will not be allowed to drive home. IF YOU ARE HAVING SURGERY AND GOING HOME THE SAME DAY, YOU MUST HAVE AN ADULT TO DRIVE YOU HOME AND BE WITH YOU FOR 24 HOURS. YOU MAY GO HOME BY TAXI OR UBER OR ORTHERWISE, BUT AN ADULT MUST ACCOMPANY YOU HOME AND STAY WITH YOU FOR 24 HOURS.  Name and phone number of your driver:  Special Instructions: N/A              Please read over the following fact sheets you were given: _____________________________________________________________________  [facility] - Preparing for Surgery Before surgery, you can play an important role.  Because skin is not sterile, your skin needs to be as free of germs as possible.  You can reduce the number of germs on your skin by washing with CHG (chlorahexidine gluconate) soap before  surgery.  CHG is an antiseptic cleaner which kills germs and bonds with the skin to continue killing germs even after  washing. Please DO NOT use if you have an allergy to CHG or antibacterial soaps.  If your skin becomes reddened/irritated stop using the CHG and inform your nurse when you arrive at Short Stay. Do not shave (including legs and underarms) for at least 48 hours prior to the first CHG shower.  You may shave your face/neck. Please follow these instructions carefully:  1.  Shower with CHG Soap the night before surgery and the  morning of Surgery.  2.  If you choose to wash your hair, wash your hair first as usual with your  normal  shampoo.  3.  After you shampoo, rinse your hair and body thoroughly to remove the  shampoo.                           4.  Use CHG as you would any other liquid soap.  You can apply chg directly  to the skin and wash                       Gently with a scrungie or clean washcloth.  5.  Apply the CHG Soap to your body ONLY FROM THE NECK DOWN.   Do not use on face/ open                           Wound or open sores. Avoid contact with eyes, ears mouth and genitals (private parts).                       Wash face,  Genitals (private parts) with your normal soap.             6.  Wash thoroughly, paying special attention to the area where your surgery  will be performed.  7.  Thoroughly rinse your body with warm water from the neck down.  8.  DO NOT shower/wash with your normal soap after using and rinsing off  the CHG Soap.                9.  Pat yourself dry with a clean towel.            10.  Wear clean pajamas.            11.  Place clean sheets on your bed the night of your first shower and do not  sleep with pets. Day of Surgery : Do not apply any lotions/deodorants the morning of surgery.  Please wear clean clothes to the hospital/surgery center.  FAILURE TO FOLLOW THESE INSTRUCTIONS MAY RESULT IN THE CANCELLATION OF YOUR SURGERY PATIENT SIGNATURE_________________________________  NURSE  SIGNATURE__________________________________  ________________________________________________________________________   Adam Phenix  An incentive spirometer is a tool that can help keep your lungs clear and active. This tool measures how well you are filling your lungs with each breath. Taking long deep breaths may help reverse or decrease the chance of developing breathing (pulmonary) problems (especially infection) following:  A long period of time when you are unable to move or be active. BEFORE THE PROCEDURE   If the spirometer includes an indicator to show your best effort, your nurse or respiratory therapist will set it to a desired goal.  If possible, sit up straight or lean slightly forward. Try not  to slouch.  Hold the incentive spirometer in an upright position. INSTRUCTIONS FOR USE  1. Sit on the edge of your bed if possible, or sit up as far as you can in bed or on a chair. 2. Hold the incentive spirometer in an upright position. 3. Breathe out normally. 4. Place the mouthpiece in your mouth and seal your lips tightly around it. 5. Breathe in slowly and as deeply as possible, raising the piston or the ball toward the top of the column. 6. Hold your breath for 3-5 seconds or for as long as possible. Allow the piston or ball to fall to the bottom of the column. 7. Remove the mouthpiece from your mouth and breathe out normally. 8. Rest for a few seconds and repeat Steps 1 through 7 at least 10 times every 1-2 hours when you are awake. Take your time and take a few normal breaths between deep breaths. 9. The spirometer may include an indicator to show your best effort. Use the indicator as a goal to work toward during each repetition. 10. After each set of 10 deep breaths, practice coughing to be sure your lungs are clear. If you have an incision (the cut made at the time of surgery), support your incision when coughing by placing a pillow or rolled up towels firmly  against it. Once you are able to get out of bed, walk around indoors and cough well. You may stop using the incentive spirometer when instructed by your caregiver.  RISKS AND COMPLICATIONS  Take your time so you do not get dizzy or light-headed.  If you are in pain, you may need to take or ask for pain medication before doing incentive spirometry. It is harder to take a deep breath if you are having pain. AFTER USE  Rest and breathe slowly and easily.  It can be helpful to keep track of a log of your progress. Your caregiver can provide you with a simple table to help with this. If you are using the spirometer at home, follow these instructions: [place] IF:   You are having difficultly using the spirometer.  You have trouble using the spirometer as often as instructed.  Your pain medication is not giving enough relief while using the spirometer.  You develop fever of 100.5 F (38.1 C) or higher. SEEK IMMEDIATE MEDICAL CARE IF:   You cough up bloody sputum that had not been present before.  You develop fever of 102 F (38.9 C) or greater.  You develop worsening pain at or near the incision site. MAKE SURE YOU:   Understand these instructions.  Will watch your condition.  Will get help right away if you are not doing well or get worse. Document Released: 04/16/2007 Document Revised: 02/26/2012 Document Reviewed: 06/17/2007 [facility] Patient Information 2014 [place], [place].   ________________________________________________________________________

## 2020-05-10 ENCOUNTER — Encounter (HOSPITAL_COMMUNITY): Payer: Self-pay

## 2020-05-10 ENCOUNTER — Encounter (HOSPITAL_COMMUNITY)
Admission: RE | Admit: 2020-05-10 | Discharge: 2020-05-10 | Disposition: A | Discharge status: Home / Self Care | Payer: 59 | Source: Ambulatory Visit | Attending: Specialist | Admitting: Specialist

## 2020-05-10 ENCOUNTER — Other Ambulatory Visit: Payer: Self-pay

## 2020-05-10 ENCOUNTER — Encounter (HOSPITAL_COMMUNITY): Payer: 59

## 2020-05-10 DIAGNOSIS — Z01818 Encounter for other preprocedural examination: Secondary | ICD-10-CM | POA: Insufficient documentation

## 2020-05-10 HISTORY — DX: Unspecified osteoarthritis, unspecified site: M19.90

## 2020-05-10 LAB — CBC WITH DIFFERENTIAL/PLATELET
Abs Immature Granulocytes: 0.01 10*3/uL (ref 0.00–0.07)
Basophils Absolute: 0 10*3/uL (ref 0.0–0.1)
Basophils Relative: 0 %
Eosinophils Absolute: 0.1 10*3/uL (ref 0.0–0.5)
Eosinophils Relative: 2 %
HCT: 37.2 % (ref 36.0–46.0)
Hemoglobin: 12.2 g/dL (ref 12.0–15.0)
Immature Granulocytes: 0 %
Lymphocytes Relative: 44 %
Lymphs Abs: 1.7 10*3/uL (ref 0.7–4.0)
MCH: 30.7 pg (ref 26.0–34.0)
MCHC: 32.8 g/dL (ref 30.0–36.0)
MCV: 93.5 fL (ref 80.0–100.0)
Monocytes Absolute: 0.4 10*3/uL (ref 0.1–1.0)
Monocytes Relative: 9 %
Neutro Abs: 1.8 10*3/uL (ref 1.7–7.7)
Neutrophils Relative %: 45 %
Platelets: 188 10*3/uL (ref 150–400)
RBC: 3.98 MIL/uL (ref 3.87–5.11)
RDW: 12.9 % (ref 11.5–15.5)
WBC: 3.9 10*3/uL — ABNORMAL LOW (ref 4.0–10.5)
nRBC: 0 % (ref 0.0–0.2)

## 2020-05-10 LAB — BASIC METABOLIC PANEL
Anion gap: 6 (ref 5–15)
BUN: 17 mg/dL (ref 6–20)
CO2: 28 mmol/L (ref 22–32)
Calcium: 9.3 mg/dL (ref 8.9–10.3)
Chloride: 107 mmol/L (ref 98–111)
Creatinine, Ser: 0.62 mg/dL (ref 0.44–1.00)
GFR calc Af Amer: >60 mL/min (ref >60)
GFR calc non Af Amer: >60 mL/min (ref >60)
Glucose, Bld: 102 mg/dL — ABNORMAL HIGH (ref 70–99)
Potassium: 4.3 mmol/L (ref 3.5–5.1)
Sodium: 141 mmol/L (ref 135–145)

## 2020-05-10 NOTE — Progress Notes (Signed)
PCP - Dr. Viviana Simpler. LOV: 08/26/19. Cardiologist -   Chest x-ray -  EKG -  Stress Test -  ECHO -  Cardiac Cath -   Sleep Study -  CPAP -   Fasting Blood Sugar -  Checks Blood Sugar _____ times a day  Blood Thinner Instructions: Aspirin Instructions: Last Dose:  Anesthesia review:   Patient denies shortness of breath, fever, cough and chest pain at PAT appointment   Patient verbalized understanding of instructions that were given to them at the PAT appointment. Patient was also instructed that they will need to review over the PAT instructions again at home before surgery.

## 2020-05-15 ENCOUNTER — Other Ambulatory Visit (HOSPITAL_COMMUNITY)
Admission: RE | Admit: 2020-05-15 | Discharge: 2020-05-15 | Disposition: A | Discharge status: Home / Self Care | Payer: 59 | Source: Ambulatory Visit | Attending: Specialist | Admitting: Specialist

## 2020-05-15 DIAGNOSIS — Z01812 Encounter for preprocedural laboratory examination: Secondary | ICD-10-CM | POA: Insufficient documentation

## 2020-05-15 DIAGNOSIS — Z20822 Contact with and (suspected) exposure to covid-19: Secondary | ICD-10-CM | POA: Insufficient documentation

## 2020-05-16 LAB — SARS CORONAVIRUS 2 (TAT 6-24 HRS): SARS Coronavirus 2: NEGATIVE

## 2020-05-18 NOTE — Anesthesia Preprocedure Evaluation (Addendum)
Anesthesia Evaluation  Patient identified by MRN, date of birth, ID band Patient awake    Reviewed: Allergy & Precautions, NPO status , Patient's Chart, lab work & pertinent test results  History of Anesthesia Complications Negative for: history of anesthetic complications  Airway Mallampati: II  TM Distance: >3 FB Neck ROM: Full    Dental  (+) Missing,    Pulmonary neg pulmonary ROS,    Pulmonary exam normal        Cardiovascular hypertension, Pt. on medications Normal cardiovascular exam     Neuro/Psych negative neurological ROS  negative psych ROS   GI/Hepatic negative GI ROS, Neg liver ROS,   Endo/Other  negative endocrine ROS  Renal/GU negative Renal ROS  negative genitourinary   Musculoskeletal  (+) Arthritis ,   Abdominal   Peds  Hematology negative hematology ROS (+)   Anesthesia Other Findings Day of surgery medications reviewed with patient.  Reproductive/Obstetrics negative OB ROS                            Anesthesia Physical Anesthesia Plan  ASA: II  Anesthesia Plan: General   Post-op Pain Management:    Induction: Intravenous  PONV Risk Score and Plan: 4 or greater and Treatment may vary due to age or medical condition, Ondansetron, Dexamethasone, Midazolam, TIVA and Propofol infusion  Airway Management Planned: LMA  Additional Equipment: None  Intra-op Plan:   Post-operative Plan: Extubation in OR  Informed Consent: I have reviewed the patients History and Physical, chart, labs and discussed the procedure including the risks, benefits and alternatives for the proposed anesthesia with the patient or authorized representative who has indicated his/her understanding and acceptance.     Dental advisory given  Plan Discussed with: CRNA  Anesthesia Plan Comments:        Anesthesia Quick Evaluation

## 2020-05-19 ENCOUNTER — Encounter (HOSPITAL_COMMUNITY): Payer: Self-pay | Admitting: Specialist

## 2020-05-19 ENCOUNTER — Encounter (HOSPITAL_COMMUNITY)
Admission: RE | Disposition: A | Discharge status: Home / Self Care | Payer: Self-pay | Source: Home / Self Care | Attending: Specialist

## 2020-05-19 ENCOUNTER — Ambulatory Visit (HOSPITAL_COMMUNITY): Payer: 59 | Admitting: Certified Registered Nurse Anesthetist

## 2020-05-19 ENCOUNTER — Ambulatory Visit (HOSPITAL_COMMUNITY)
Admission: RE | Admit: 2020-05-19 | Discharge: 2020-05-19 | Disposition: A | Discharge status: Home / Self Care | Payer: 59 | Attending: Specialist | Admitting: Specialist

## 2020-05-19 DIAGNOSIS — S83281A Other tear of lateral meniscus, current injury, right knee, initial encounter: Secondary | ICD-10-CM | POA: Insufficient documentation

## 2020-05-19 DIAGNOSIS — S83241A Other tear of medial meniscus, current injury, right knee, initial encounter: Secondary | ICD-10-CM | POA: Diagnosis not present

## 2020-05-19 DIAGNOSIS — I10 Essential (primary) hypertension: Secondary | ICD-10-CM | POA: Diagnosis not present

## 2020-05-19 DIAGNOSIS — M1990 Unspecified osteoarthritis, unspecified site: Secondary | ICD-10-CM | POA: Insufficient documentation

## 2020-05-19 DIAGNOSIS — Z79899 Other long term (current) drug therapy: Secondary | ICD-10-CM | POA: Insufficient documentation

## 2020-05-19 DIAGNOSIS — X58XXXA Exposure to other specified factors, initial encounter: Secondary | ICD-10-CM | POA: Diagnosis not present

## 2020-05-19 DIAGNOSIS — M1711 Unilateral primary osteoarthritis, right knee: Secondary | ICD-10-CM

## 2020-05-19 HISTORY — PX: KNEE ARTHROSCOPY WITH LATERAL MENISECTOMY: SHX6193

## 2020-05-19 SURGERY — ARTHROSCOPY, KNEE, WITH LATERAL MENISCECTOMY
Anesthesia: General | Site: Knee | Laterality: Right

## 2020-05-19 MED ORDER — MIDAZOLAM HCL 2 MG/2ML IJ SOLN
INTRAMUSCULAR | Status: DC | PRN
  Administered 2020-05-19: 2 mg via INTRAVENOUS

## 2020-05-19 MED ORDER — OXYCODONE-ACETAMINOPHEN 5-325 MG PO TABS: 1.0000 | ORAL_TABLET | ORAL | 0 refills | Status: DC | PRN

## 2020-05-19 MED ORDER — DEXAMETHASONE SODIUM PHOSPHATE 10 MG/ML IJ SOLN
INTRAMUSCULAR | Status: AC
  Filled 2020-05-19: qty 1

## 2020-05-19 MED ORDER — PHENYLEPHRINE 40 MCG/ML (10ML) SYRINGE FOR IV PUSH (FOR BLOOD PRESSURE SUPPORT)
PREFILLED_SYRINGE | INTRAVENOUS | Status: DC | PRN
  Administered 2020-05-19: 40 ug via INTRAVENOUS

## 2020-05-19 MED ORDER — PROPOFOL 10 MG/ML IV BOLUS
INTRAVENOUS | Status: AC
  Filled 2020-05-19: qty 20

## 2020-05-19 MED ORDER — CEFAZOLIN SODIUM-DEXTROSE 2-4 GM/100ML-% IV SOLN
2.0000 g | INTRAVENOUS | Status: AC
  Administered 2020-05-19: 2 g via INTRAVENOUS
  Filled 2020-05-19: qty 100

## 2020-05-19 MED ORDER — ACETAMINOPHEN 500 MG PO TABS
1000.0000 mg | ORAL_TABLET | Freq: Once | ORAL | Status: AC
  Administered 2020-05-19: 1000 mg via ORAL
  Filled 2020-05-19: qty 2

## 2020-05-19 MED ORDER — PROPOFOL 1000 MG/100ML IV EMUL
INTRAVENOUS | Status: AC
  Filled 2020-05-19: qty 100

## 2020-05-19 MED ORDER — LIDOCAINE 2% (20 MG/ML) 5 ML SYRINGE
INTRAMUSCULAR | Status: DC | PRN
  Administered 2020-05-19: 100 mg via INTRAVENOUS

## 2020-05-19 MED ORDER — PROPOFOL 10 MG/ML IV BOLUS
INTRAVENOUS | Status: DC | PRN
  Administered 2020-05-19: 200 mg via INTRAVENOUS

## 2020-05-19 MED ORDER — LACTATED RINGERS IV SOLN: INTRAVENOUS | Status: DC

## 2020-05-19 MED ORDER — FENTANYL CITRATE (PF) 100 MCG/2ML IJ SOLN
INTRAMUSCULAR | Status: AC
  Filled 2020-05-19: qty 2

## 2020-05-19 MED ORDER — LACTATED RINGERS IR SOLN
Status: DC | PRN
  Administered 2020-05-19: 9000 mL

## 2020-05-19 MED ORDER — ONDANSETRON HCL 4 MG/2ML IJ SOLN
INTRAMUSCULAR | Status: AC
  Filled 2020-05-19: qty 2

## 2020-05-19 MED ORDER — PROPOFOL 500 MG/50ML IV EMUL
INTRAVENOUS | Status: DC | PRN
  Administered 2020-05-19: 150 ug/kg/min via INTRAVENOUS

## 2020-05-19 MED ORDER — BUPIVACAINE-EPINEPHRINE 0.5% -1:200000 IJ SOLN
INTRAMUSCULAR | Status: AC
  Filled 2020-05-19: qty 1

## 2020-05-19 MED ORDER — OXYCODONE HCL 5 MG/5ML PO SOLN: 5.0000 mg | Freq: Once | ORAL | Status: DC | PRN

## 2020-05-19 MED ORDER — DEXAMETHASONE SODIUM PHOSPHATE 10 MG/ML IJ SOLN
INTRAMUSCULAR | Status: DC | PRN
  Administered 2020-05-19: 6 mg via INTRAVENOUS

## 2020-05-19 MED ORDER — ONDANSETRON HCL 4 MG/2ML IJ SOLN
INTRAMUSCULAR | Status: DC | PRN
  Administered 2020-05-19: 4 mg via INTRAVENOUS

## 2020-05-19 MED ORDER — MIDAZOLAM HCL 2 MG/2ML IJ SOLN
INTRAMUSCULAR | Status: AC
  Filled 2020-05-19: qty 2

## 2020-05-19 MED ORDER — FENTANYL CITRATE (PF) 100 MCG/2ML IJ SOLN
INTRAMUSCULAR | Status: DC | PRN
  Administered 2020-05-19: 50 ug via INTRAVENOUS
  Administered 2020-05-19 (×4): 25 ug via INTRAVENOUS
  Administered 2020-05-19: 50 ug via INTRAVENOUS

## 2020-05-19 MED ORDER — EPINEPHRINE PF 1 MG/ML IJ SOLN
INTRAMUSCULAR | Status: AC
  Filled 2020-05-19: qty 1

## 2020-05-19 MED ORDER — OXYCODONE HCL 5 MG PO TABS: 5.0000 mg | ORAL_TABLET | Freq: Once | ORAL | Status: DC | PRN

## 2020-05-19 MED ORDER — ORAL CARE MOUTH RINSE: 15.0000 mL | Freq: Once | OROMUCOSAL | Status: AC

## 2020-05-19 MED ORDER — FENTANYL CITRATE (PF) 100 MCG/2ML IJ SOLN
25.0000 ug | INTRAMUSCULAR | Status: DC | PRN
  Administered 2020-05-19: 50 ug via INTRAVENOUS

## 2020-05-19 MED ORDER — PHENYLEPHRINE 40 MCG/ML (10ML) SYRINGE FOR IV PUSH (FOR BLOOD PRESSURE SUPPORT)
PREFILLED_SYRINGE | INTRAVENOUS | Status: AC
  Filled 2020-05-19: qty 10

## 2020-05-19 MED ORDER — CHLORHEXIDINE GLUCONATE 0.12 % MT SOLN
15.0000 mL | Freq: Once | OROMUCOSAL | Status: AC
  Administered 2020-05-19: 15 mL via OROMUCOSAL

## 2020-05-19 MED ORDER — EPINEPHRINE PF 1 MG/ML IJ SOLN
INTRAMUSCULAR | Status: DC | PRN
  Administered 2020-05-19: 1 mg

## 2020-05-19 MED ORDER — BUPIVACAINE-EPINEPHRINE 0.5% -1:200000 IJ SOLN
INTRAMUSCULAR | Status: DC | PRN
  Administered 2020-05-19: 25 mL

## 2020-05-19 MED ORDER — PROMETHAZINE HCL 25 MG/ML IJ SOLN: 6.2500 mg | INTRAMUSCULAR | Status: DC | PRN

## 2020-05-19 SURGICAL SUPPLY — 40 items
BLADE SHAVER TORPEDO 4X13 (MISCELLANEOUS) ×1 IMPLANT
BLADE SURG SZ11 CARB STEEL (BLADE) IMPLANT
BNDG ELASTIC 6X10 VLCR STRL LF (GAUZE/BANDAGES/DRESSINGS) ×1 IMPLANT
BNDG ELASTIC 6X5.8 VLCR STR LF (GAUZE/BANDAGES/DRESSINGS) IMPLANT
BOOTIES KNEE HIGH SLOAN (MISCELLANEOUS) ×4 IMPLANT
CANNULA ACUFLEX KIT 5X76 (CANNULA) ×2 IMPLANT
COVER SURGICAL LIGHT HANDLE (MISCELLANEOUS) ×2 IMPLANT
COVER WAND RF STERILE (DRAPES) IMPLANT
DISSECTOR 3.5MM X 13CM (MISCELLANEOUS) IMPLANT
DISSECTOR 3.5MM X 13CM CVD (MISCELLANEOUS) IMPLANT
DISSECTOR 4.0MMX13CM CVD (MISCELLANEOUS) IMPLANT
DRAPE IMP U-DRAPE 54X76 (DRAPES) ×1 IMPLANT
DRSG EMULSION OIL 3X3 NADH (GAUZE/BANDAGES/DRESSINGS) ×2 IMPLANT
DRSG PAD ABDOMINAL 8X10 ST (GAUZE/BANDAGES/DRESSINGS) ×1 IMPLANT
DURAPREP 26ML APPLICATOR (WOUND CARE) ×2 IMPLANT
ELECT MENISCUS 165MM 90D (ELECTRODE) IMPLANT
GAUZE SPONGE 4X4 12PLY STRL (GAUZE/BANDAGES/DRESSINGS) ×1 IMPLANT
GLOVE BIOGEL PI IND STRL 7.5 (GLOVE) ×1 IMPLANT
GLOVE BIOGEL PI IND STRL 8 (GLOVE) ×1 IMPLANT
GLOVE BIOGEL PI INDICATOR 7.5 (GLOVE) ×1
GLOVE BIOGEL PI INDICATOR 8 (GLOVE)
GLOVE SURG SS PI 7.5 STRL IVOR (GLOVE) ×4 IMPLANT
GLOVE SURG SS PI 8.0 STRL IVOR (GLOVE) ×4 IMPLANT
GOWN STRL REUS W/TWL LRG LVL3 (GOWN DISPOSABLE) ×2 IMPLANT
GOWN STRL REUS W/TWL XL LVL3 (GOWN DISPOSABLE) ×4 IMPLANT
KIT BASIN (CUSTOM PROCEDURE TRAY) ×1 IMPLANT
KIT TURNOVER KIT A (KITS) IMPLANT
MANIFOLD NEPTUNE II (INSTRUMENTS) ×4 IMPLANT
PACK ARTHROSCOPY WL (CUSTOM PROCEDURE TRAY) ×2 IMPLANT
PADDING CAST COTTON 6X4 STRL (CAST SUPPLIES) ×2 IMPLANT
PENCIL SMOKE EVACUATOR (MISCELLANEOUS) IMPLANT
PORT APPOLLO RF 90DEGREE MULTI (SURGICAL WAND) IMPLANT
PROBE BIPOLAR ATHRO 135MM 90D (MISCELLANEOUS) IMPLANT
SUT ETHILON 4 0 PS 2 18 (SUTURE) ×2 IMPLANT
TOWEL OR 17X26 10 PK STRL BLUE (TOWEL DISPOSABLE) ×2 IMPLANT
TUBING ARTHROSCOPY IRRIG 16FT (MISCELLANEOUS) ×2 IMPLANT
TUBING CONNECTING 10 (TUBING) ×1 IMPLANT
WAND APOLLORF SJ50 AR-9845 (SURGICAL WAND) ×2 IMPLANT
WIPE CHG CHLORHEXIDINE 2% (PERSONAL CARE ITEMS) ×2 IMPLANT
WRAP KNEE MAXI GEL POST OP (GAUZE/BANDAGES/DRESSINGS) ×2 IMPLANT

## 2020-05-19 NOTE — Anesthesia Procedure Notes (Signed)
Procedure Name: LMA Insertion Date/Time: 05/19/2020 8:45 AM Performed by: West Pugh, CRNA Pre-anesthesia Checklist: Patient identified, Emergency Drugs available, Suction available, Patient being monitored and Timeout performed Patient Re-evaluated:Patient Re-evaluated prior to induction Oxygen Delivery Method: Circle system utilized Preoxygenation: Pre-oxygenation with 100% oxygen Induction Type: IV induction LMA: LMA with gastric port inserted LMA Size: 4.0 Number of attempts: 1 Placement Confirmation: positive ETCO2 and breath sounds checked- equal and bilateral Tube secured with: Tape Dental Injury: Teeth and Oropharynx as per pre-operative assessment

## 2020-05-19 NOTE — Brief Op Note (Signed)
05/19/2020  10:03 AM  PATIENT:  Courtney Sheppard  59 y.o. 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, degnerative joint disease  PROCEDURE:  Procedure(s) with comments: Right knee arthroscopy, partial medial and lateral meniscectomies and debridement (Right) - 60 mins  SURGEON:  Surgeon(s) and Role:    Susa Day, MD - Primary  PHYSICIAN ASSISTANT:   ASSISTANTS: Bissell   ANESTHESIA:   general  EBLminimal  BLOOD ADMINISTERED:none  DRAINS: none   LOCAL MEDICATIONS USED:  MARCAINE     SPECIMEN:  No Specimen  DISPOSITION OF SPECIMEN:  N/A  COUNTS:  YES  TOURNIQUET:  * No tourniquets in log *  DICTATION: .Other Dictation: Dictation Number [id number]  PLAN OF CARE: Discharge to home after PACU  PATIENT DISPOSITION:  PACU - hemodynamically stable.   Delay start of Pharmacological VTE agent (>24hrs) due to surgical blood loss or risk of bleeding: no

## 2020-05-19 NOTE — H&P (Signed)
Courtney Sheppard is an 59 y.o. female.   Chief Complaint: Right knee pain  HPI: Right knee pain with locking and giving way refractory.  Past Medical History:  Diagnosis Date  . Allergic rhinitis due to pollen   . Arthritis   . Hypertension     Past Surgical History:  Procedure Laterality Date  . BREAST BIOPSY  2014   negative  . FOOT SURGERY    . herniated disk    . UTERINE FIBROID SURGERY      Family History  Problem Relation Age of Onset  . Hypertension Mother   . Diabetes Mother   . Stroke Mother        3 strokes  . Breast cancer Mother   . Heart failure Father   . Heart disease Father   . Alcohol abuse Brother   . Alcohol abuse Brother   . Breast cancer Cousin   . Colon cancer Neg Hx   . Esophageal cancer Neg Hx   . Rectal cancer Neg Hx   . Stomach cancer Neg Hx    Social History:  reports that she has never smoked. She has never used smokeless tobacco. She reports current alcohol use. She reports that she does not use drugs.  Allergies:  Allergies  Allergen Reactions  . Aspirin Nausea Only    Medications Prior to Admission  Medication Sig Dispense Refill  . cetirizine (ZYRTEC) 10 MG tablet Take 10 mg by mouth daily.    . Menthol, Topical Analgesic, (ICY HOT EX) Apply 1 application topically daily as needed (knee pain).    . naproxen sodium (ALEVE) 220 MG tablet Take 220 mg by mouth 2 (two) times daily as needed (pain).    . triamterene-hydrochlorothiazide (MAXZIDE-25) 37.5-25 MG tablet TAKE 1 TABLET BY MOUTH EVERY DAY (Patient taking differently: Take 1 tablet by mouth daily. ) 30 tablet 11  . fluticasone (FLONASE) 50 MCG/ACT nasal spray Place 2 sprays into both nostrils daily. (Patient not taking: Reported on 05/05/2020) 16 g 6  . Multiple Vitamin (MULTIVITAMIN) tablet Take 1 tablet by mouth daily.      No results found for this or any previous visit (from the past 48 hour(s)). No results found.  Review of Systems  Musculoskeletal: Positive for joint  swelling.  All other systems reviewed and are negative.   Blood pressure (!) 154/89, pulse 73, temperature 97.9 F (36.6 C), temperature source Oral, resp. rate 16, height 5\' 7"  (1.702 m), weight 101.2 kg, last menstrual period 10/18/2014, SpO2 98 %. Physical Exam  Constitutional: She is oriented to person, place, and time. She appears well-developed.  HENT:  Head: Normocephalic.  Eyes: Pupils are equal, round, and reactive to light.  Cardiovascular: Normal rate.  Respiratory: Effort normal.  GI: Soft.  Musculoskeletal:        General: Tenderness present.     Cervical back: Normal range of motion.     Comments: Tender medial joint line. Patellofemoral pain with compression. ROM 0-140. Mild effusion. No DVT. Equiv. Mcmurray  Neurological: She is alert and oriented to person, place, and time.  Skin: Skin is dry.  Psychiatric: She has a normal mood and affect.    MRI: Tricompartmental DJD with Tear anterior horn lat meniscus. Tear medial meniscus.  Assessment/Plan  Right knee pain due to meniscus tear and DJD. Plan right knee arthroscopy partial meniscectomy debr and possible lateral release.Risks and benefits discussed incl dvt, infection, need for replacement Etc.  Johnn Hai, MD 05/19/2020, 8:27 AM

## 2020-05-19 NOTE — Discharge Instructions (Signed)
ARTHROSCOPIC KNEE SURGERY HOME CARE INSTRUCTIONS   PAIN You will be expected to have a moderate amount of pain in the affected knee for approximately two weeks.  However, the first two to four days will be the most severe in terms of the pain you will experience.  Prescriptions have been provided for you to take as needed for the pain.  The pain can be markedly reduced by using the ice/compressive bandage given.  Exchange the ice packs whenever they thaw.  During the night, keep the bandage on because it will still provide some compression for the swelling.  Also, keep the leg elevated on pillows above your heart, and this will help alleviate the pain and swelling.  MEDICATION Prescriptions have been provided to take as needed for pain. To prevent blood clots, take Aspirin 325mg  daily with a meal if not on a blood thinner and if no history of stomach ulcers.  ACTIVITY It is preferred that you stay on bedrest for approximately 24 hours.  However, you may go to the bathroom with help.  After this, you can start to be up and about progressively more.  Remember that the swelling may still increase after three to four days if you are up and doing too much.  You may put as much weight on the affected leg as pain will allow.  Use your crutches for comfort and safety.  However, as soon as you are able, you may discard the crutches and go without them.   DRESSING Keep the current dressing as dry as possible.  Two days after your surgery, you may remove the ice/compressive wrap, and surgical dressing.  You may now take a shower, but do not scrub the sounds directly with soap.  Let water rinse over these and gently wipe with your hand.  Reapply band-aids over the puncture wounds and more gauze if needed.  A slight amount of thin drainage can be normal at this time, and do not let it frighten you.  Reapply the ice/compressive wrap.  You may now repeat this every day each time you shower.  SYMPTOMS TO REPORT TO  YOUR DOCTOR  -Extreme pain.  -Extreme swelling.  -Temperature above 101 degrees that does not come down with acetaminophen     (Tylenol).  -Any changes in the feeling, color or movement of your toes.  -Extreme redness, heat, swelling or drainage at your incision  EXERCISE It is preferred that you begin to exercise on the day of your surgery.  Straight leg raises and short arc quads should be begun the afternoon or evening of surgery and continued until you come back for your follow-up appointment.   Attached is an instruction sheet on how to perform these two simple exercises.  Do these at least three times per day if not more.  You may bend your knee as much as is comfortable.  The puncture wounds may occasionally be slightly uncomfortable with bending of the knee.  Do not let this frighten you.  It is important to keep your knee motion, but do not overdo it.  If you have significant pain, simply do not bend the knee as far.   You will be given more exercises to perform at your first return visit.    Take one 81mg  aspirin per day  Crutches or walker for 1-2 days weight bearing as tolerated.  RETURN APPOINTMENT Please make an appointment to be seen by your doctor in 14 days from your surgery.  Patient Signature:  ________________________________________________________  Nurse's Signature:  ________________________________________________________

## 2020-05-19 NOTE — Transfer of Care (Signed)
Immediate Anesthesia Transfer of Care Note  Patient: Courtney Sheppard  Procedure(s) Performed: Right knee arthroscopy, partial medial and lateral meniscectomies and debridement (Right Knee)  Patient Location: PACU  Anesthesia Type:General  Level of Consciousness: awake and patient cooperative  Airway & Oxygen Therapy: Patient Spontanous Breathing and Patient connected to face mask oxygen  Post-op Assessment: Report given to RN and Post -op Vital signs reviewed and stable  Post vital signs: Reviewed and stable  Last Vitals:  Vitals Value Taken Time  BP 150/81 05/19/20 1016  Temp    Pulse 79 05/19/20 1019  Resp 16 05/19/20 1019  SpO2 100 % 05/19/20 1019  Vitals shown include unvalidated device data.  Last Pain:  Vitals:   05/19/20 0714  TempSrc:   PainSc: 3       Patients Stated Pain Goal: 2 ([id number])  Complications: No apparent anesthesia complications

## 2020-05-19 NOTE — Anesthesia Postprocedure Evaluation (Signed)
Anesthesia Post Note  Patient: Courtney Sheppard  Procedure(s) Performed: Right knee arthroscopy, partial medial and lateral meniscectomies and debridement (Right Knee)     Patient location during evaluation: PACU Anesthesia Type: General Level of consciousness: awake and alert Pain management: pain level controlled Vital Signs Assessment: post-procedure vital signs reviewed and stable Respiratory status: spontaneous breathing, nonlabored ventilation, respiratory function stable and patient connected to nasal cannula oxygen Cardiovascular status: blood pressure returned to baseline and stable Postop Assessment: no apparent nausea or vomiting Anesthetic complications: no    Last Vitals:  Vitals:   05/19/20 1057 05/19/20 1108  BP: (!) 152/90   Pulse: 70   Resp: 15 16  Temp: 36.6 C 36.6 C  SpO2: 98% 97%    Last Pain:  Vitals:   05/19/20 1108  TempSrc:   PainSc: 0-No pain                 Joanne Salah DAVID

## 2020-05-19 NOTE — Progress Notes (Signed)
Orthopedic Tech Progress Note Patient Details:  Courtney Sheppard 1961/10/23 [id number]  Ortho Devices Type of Ortho Device: Crutches Ortho Device/Splint Interventions: Adjustment   Post Interventions Patient Tolerated: Well Instructions Provided: Care of device   [place] Pink 05/19/2020, 11:55 AM

## 2020-05-20 NOTE — Op Note (Signed)
NAME: Courtney Sheppard, AMANN MEDICAL RECORD [id number] ACCOUNT [id number] DATE OF BIRTH:11-25-1961 FACILITY: [facility] LOCATION: [facility] PHYSICIAN:Coleman Kalas Windy Kalata, MD  OPERATIVE REPORT  DATE OF PROCEDURE:  05/19/2020  PREOPERATIVE DIAGNOSES:    1.  Medial and lateral meniscus tears of the right knee. 2.  Osteoarthritis.  POSTOPERATIVE DIAGNOSES: 1.  Medial and lateral meniscus tears of the right knee.   2.  Osteoarthritis. 3.  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  PROCEDURES PERFORMED: 1.  Right knee arthroscopy. 2.  Partial medial and lateral meniscectomies. 3.  Chondroplasty of the patellofemoral sulcus, bilateral tibial plateau.  ANESTHESIA:  General.  SURGEON:  Susa Day, MD  ASSISTANT:  Lacie Draft, PA  HISTORY:  This is a 59 year old female with locking, popping, giving way of the knee, mainly pain over the anterior aspect of the knee with activities and swelling.  This has been refractory to rest, activity modification, corticosteroid injections and  home exercise program.  MRI indicating radial tear of the anterior horn of the lateral meniscus, possible medial meniscus tear and chondromalacia.  She was indicated for knee arthroscopy, partial meniscectomy.  Risks and benefits discussed including  bleeding, infection, damage to neurovascular structures, no change in symptoms, worsening symptoms, DVT, PE, anesthetic complications, etc.  DESCRIPTION OF PROCEDURE:  With the patient in the supine position after the induction of adequate general anesthesia and 2 g Kefzol, the right lower extremity was prepped and draped in the usual sterile fashion.  A lateral parapatellar portal was  fashioned with a #11 blade.  Ingress cannula atraumatically placed.  Irrigant was utilized to insufflate the joint.  Under direct visualization, a medial parapatellar portal was fashioned with a #11 blade after localization with an  18-gauge needle,  sparing the medial meniscus.  Arthroscopic irrigant was at 65 mm.  Examination of the medial compartment revealed some radial tearing of the medial meniscus in the junction of the mid and posterior third.  I introduced a shaver and performed a partial meniscectomy to a stable base.  There was a small component to this.   The remnant of the meniscus was stable to probe palpation.  There were some minor degenerative changes of the femoral condyle noted and minimal grade III changes.  Next, the ACL was examined.  There were some degenerative changes within the notch.  Light chondroplasty of the femoral condyle was performed  Again in the ACL there were some slight degenerative changes of her notch, but the ACL appeared to be intact.  The lateral compartment revealed extensive grade III changes of the lateral tibial plateau with fibrillation and loose cartilaginous debris.  I introduced a shaver and performed a light chondroplasty here.  The femoral condyle was only marginally  involved.  There was a radial tear of the anterolateral horn of the meniscus.  This was at the junction of the middle and anterior third.  Introduced a shaver and shaved and bevelled the portion of the tear.  There was also some extensive degeneration of the  meniscal capsular junction here and degenerative tearing of the lateral third.  I also introduced a basket, straight, which was unable to reach the radial tear.  I used a left angling basket which removed a portion and bevelled a portion of the tear that  I felt was potentially symptomatic.  I then used a straight basket from the medial portal and also from the lateral portal, switching portals to gain access to the radial tear, which  was difficult due to its positioning.  I was able to bevel it.  I feel  that I beveled to the point that it would not be impinging.  We trephinated the area of the meniscal capsular junction for stimulation of some bleeding.  The  remnant of that was stable to probe palpation.  Specifically, the attachment of the lateral  meniscus was still intact.  One-third of the inner portion of the meniscus was removed.  Again, it was significantly degenerated.  The posterior third and mid third was intact as well.  No grade IV changes were seen.  I also used an ArthroWand to bevel  and contour the radial tear, switching portals here as well.  Then, in the suprapatellar pouch was grade III change significantly of the patella and femoral sulcus.  Light chondroplasties were performed of both.  The gutters were unremarkable.  There was normal patellofemoral tracking.  I revisited all compartments.  No further pathology amenable to arthroscopic intervention.  I therefore removed all instrumentation.  Portals were closed with 4-0 nylon simple sutures.  Marcaine 0.25% with epinephrine was infiltrated in the joint.  Wound  was dressed sterilely.  She was awoken without difficulty and transported to the recovery room in satisfactory condition.  The patient tolerated the procedure well.  No complications.  Assistant Lacie Draft, [place].  Minimal blood loss.  VN/NUANCE  D:05/20/2020 T:05/20/2020 JOB:[id number]

## 2020-06-29 ENCOUNTER — Other Ambulatory Visit: Payer: Self-pay | Admitting: Internal Medicine

## 2020-08-27 ENCOUNTER — Encounter: Payer: BLUE CROSS/BLUE SHIELD | Admitting: Internal Medicine

## 2021-01-03 ENCOUNTER — Encounter: Payer: 59 | Admitting: Internal Medicine

## 2021-02-21 ENCOUNTER — Encounter: Payer: Self-pay | Admitting: Gastroenterology

## 2021-03-30 ENCOUNTER — Encounter: Payer: 59 | Admitting: Internal Medicine

## 2021-04-18 ENCOUNTER — Ambulatory Visit (AMBULATORY_SURGERY_CENTER): Payer: Self-pay | Admitting: *Deleted

## 2021-04-18 ENCOUNTER — Other Ambulatory Visit: Payer: Self-pay

## 2021-04-18 VITALS — Ht 67.0 in | Wt 232.0 lb

## 2021-04-18 DIAGNOSIS — Z86010 Personal history of colonic polyps: Secondary | ICD-10-CM

## 2021-04-18 MED ORDER — NA SULFATE-K SULFATE-MG SULF 17.5-3.13-1.6 GM/177ML PO SOLN: ORAL | 0 refills | Status: DC

## 2021-04-18 NOTE — Progress Notes (Signed)
Patient and spouse is here in-person for PV. Patient denies any allergies to eggs or soy. Patient denies any problems with anesthesia/sedation. Patient denies any oxygen use at home. Patient denies taking any diet/weight loss medications or blood thinners. Patient is not being treated for MRSA or C-diff. Patient is aware of our care-partner policy and CVELF-81 safety protocol. EMMI education assigned to the patient for the procedure, sent to [place].   Patient is fully COVID-19 vaccinated, per patient.

## 2021-05-09 ENCOUNTER — Ambulatory Visit (AMBULATORY_SURGERY_CENTER): Payer: 59 | Admitting: Gastroenterology

## 2021-05-09 ENCOUNTER — Encounter: Payer: Self-pay | Admitting: Gastroenterology

## 2021-05-09 ENCOUNTER — Other Ambulatory Visit: Payer: Self-pay

## 2021-05-09 VITALS — BP 116/71 | HR 75 | Temp 96.9°F | Resp 13 | Ht 67.0 in | Wt 232.0 lb

## 2021-05-09 DIAGNOSIS — Z86010 Personal history of colonic polyps: Secondary | ICD-10-CM | POA: Diagnosis not present

## 2021-05-09 DIAGNOSIS — D122 Benign neoplasm of ascending colon: Secondary | ICD-10-CM | POA: Diagnosis not present

## 2021-05-09 MED ORDER — SODIUM CHLORIDE 0.9 % IV SOLN: 500.0000 mL | INTRAVENOUS | Status: DC

## 2021-05-09 NOTE — Progress Notes (Signed)
Pt's states no medical or surgical changes since previsit or office visit. 

## 2021-05-09 NOTE — Progress Notes (Signed)
To PACU, VSS. Report to [email or web address].tb 

## 2021-05-09 NOTE — Progress Notes (Signed)
No problems noted in the recovery room. maw 

## 2021-05-09 NOTE — Progress Notes (Signed)
Called to room to assist during endoscopic procedure.  Patient ID and intended procedure confirmed with present staff. Received instructions for my participation in the procedure from the performing physician.  

## 2021-05-09 NOTE — Patient Instructions (Addendum)
Handout was given to your care partner on polyps. You may resume your current medications today. Await biopsy results.  May take 1-3 weeks to receive pathology results. Please call if any questions or concerns.    YOU HAD AN ENDOSCOPIC PROCEDURE TODAY AT THE [facility]:   Refer to the procedure report that was given to you for any specific questions about what was found during the examination.  If the procedure report does not answer your questions, please call your gastroenterologist to clarify.  If you requested that your care partner not be given the details of your procedure findings, then the procedure report has been included in a sealed envelope for you to review at your convenience later.  YOU SHOULD EXPECT: Some feelings of bloating in the abdomen. Passage of more gas than usual.  Walking can help get rid of the air that was put into your GI tract during the procedure and reduce the bloating. If you had a lower endoscopy (such as a colonoscopy or flexible sigmoidoscopy) you may notice spotting of blood in your stool or on the toilet paper. If you underwent a bowel prep for your procedure, you may not have a normal bowel movement for a few days.  Please Note:  You might notice some irritation and congestion in your nose or some drainage.  This is from the oxygen used during your procedure.  There is no need for concern and it should clear up in a day or so.  SYMPTOMS TO REPORT IMMEDIATELY:  Following lower endoscopy (colonoscopy or flexible sigmoidoscopy):  Excessive amounts of blood in the stool  Significant tenderness or worsening of abdominal pains  Swelling of the abdomen that is new, acute  Fever of 100F or higher    For urgent or emergent issues, a gastroenterologist can be reached at any hour by calling [phone]. Do not use MyChart messaging for urgent concerns.    DIET:  We do recommend a small meal at first, but then you may proceed to your regular  diet.  Drink plenty of fluids but you should avoid alcoholic beverages for 24 hours.  ACTIVITY:  You should plan to take it easy for the rest of today and you should NOT DRIVE or use heavy machinery until tomorrow (because of the sedation medicines used during the test).    FOLLOW UP: Our staff will call the number listed on your records 48-72 hours following your procedure to check on you and address any questions or concerns that you may have regarding the information given to you following your procedure. If we do not reach you, we will leave a message.  We will attempt to reach you two times.  During this call, we will ask if you have developed any symptoms of COVID 19. If you develop any symptoms (ie: fever, flu-like symptoms, shortness of breath, cough etc.) before then, please call [phone].  If you test positive for Covid 19 in the 2 weeks post procedure, please call and report this information to us.    If any biopsies were taken you will be contacted by phone or by letter within the next 1-3 weeks.  Please call us at [phone] if you have not heard about the biopsies in 3 weeks.    SIGNATURES/CONFIDENTIALITY: You and/or your care partner have signed paperwork which will be entered into your electronic medical record.  These signatures attest to the fact that that the information above on your After Visit Summary has been   reviewed and is understood.  Full responsibility of the confidentiality of this discharge information lies with you and/or your care-partner.    

## 2021-05-09 NOTE — Op Note (Signed)
[facility] Patient Name: Courtney Sheppard Procedure Date: 05/09/2021 8:56 AM MRN: [id number] Endoscopist: Milus Banister , MD Age: 60 Referring MD:  Date of Birth: [birth year] Gender: Female Account #: [id number] Procedure:                Colonoscopy Indications:              High risk colon cancer surveillance: Personal                            history of colonic polyps; Colonoscopy 2016 three                            subCM adenomas removed Medicines:                Monitored Anesthesia Care Procedure:                Pre-Anesthesia Assessment:                           - Prior to the procedure, a History and Physical                            was performed, and patient medications and                            allergies were reviewed. The patient's tolerance of                            previous anesthesia was also reviewed. The risks                            and benefits of the procedure and the sedation                            options and risks were discussed with the patient.                            All questions were answered, and informed consent                            was obtained. Prior Anticoagulants: The patient has                            taken no previous anticoagulant or antiplatelet                            agents. ASA Grade Assessment: II - A patient with                            mild systemic disease. After reviewing the risks                            and benefits, the patient was deemed in  satisfactory condition to undergo the procedure.                           After obtaining informed consent, the colonoscope                            was passed under direct vision. Throughout the                            procedure, the patient's blood pressure, pulse, and                            oxygen saturations were monitored continuously. The                            Olympus CF-HQ190L [id number] [id number]) Colonoscope  was                            introduced through the anus and advanced to the the                            cecum, identified by appendiceal orifice and                            ileocecal valve. The colonoscopy was performed                            without difficulty. The patient tolerated the                            procedure well. The quality of the bowel                            preparation was adequate. The ileocecal valve,                            appendiceal orifice, and rectum were photographed. Scope In: 9:01:58 AM Scope Out: 9:19:46 AM Scope Withdrawal Time: 0 hours 9 minutes 11 seconds  Total Procedure Duration: 0 hours 17 minutes 48 seconds  Findings:                 A 3 mm polyp was found in the ascending colon. The                            polyp was sessile. The polyp was removed with a                            cold snare. Resection and retrieval were complete.                           The exam was otherwise without abnormality on                            direct and retroflexion views. Complications:  No immediate complications. Estimated blood loss:                            None. Estimated Blood Loss:     Estimated blood loss: none. Impression:               - One 3 mm polyp in the ascending colon, removed                            with a cold snare. Resected and retrieved.                           - The examination was otherwise normal on direct                            and retroflexion views. Recommendation:           - Patient has a contact number available for                            emergencies. The signs and symptoms of potential                            delayed complications were discussed with the                            patient. Return to normal activities tomorrow.                            Written discharge instructions were provided to the                            patient.                           - Resume  previous diet.                           - Continue present medications.                           - Await pathology results. Milus Banister, MD 05/09/2021 9:23:03 AM This report has been signed electronically.

## 2021-05-11 ENCOUNTER — Telehealth: Payer: Self-pay

## 2021-05-11 NOTE — Telephone Encounter (Signed)
LVM

## 2021-05-18 ENCOUNTER — Encounter: Payer: Self-pay | Admitting: Gastroenterology

## 2021-05-20 ENCOUNTER — Encounter: Payer: 59 | Admitting: Internal Medicine

## 2021-06-21 ENCOUNTER — Encounter: Payer: Self-pay | Admitting: Internal Medicine

## 2021-06-21 ENCOUNTER — Other Ambulatory Visit: Payer: Self-pay

## 2021-06-21 ENCOUNTER — Ambulatory Visit (INDEPENDENT_AMBULATORY_CARE_PROVIDER_SITE_OTHER): Payer: 59 | Admitting: Internal Medicine

## 2021-06-21 VITALS — BP 126/80 | HR 75 | Temp 97.1°F | Ht 66.5 in | Wt 233.0 lb

## 2021-06-21 DIAGNOSIS — I10 Essential (primary) hypertension: Secondary | ICD-10-CM | POA: Diagnosis not present

## 2021-06-21 DIAGNOSIS — Z0000 Encounter for general adult medical examination without abnormal findings: Secondary | ICD-10-CM | POA: Diagnosis not present

## 2021-06-21 LAB — CBC
HCT: 36.9 % (ref 36.0–46.0)
Hemoglobin: 12.6 g/dL (ref 12.0–15.0)
MCHC: 34.2 g/dL (ref 30.0–36.0)
MCV: 90 fl (ref 78.0–100.0)
Platelets: 197 10*3/uL (ref 150.0–400.0)
RBC: 4.1 Mil/uL (ref 3.87–5.11)
RDW: 13.9 % (ref 11.5–15.5)
WBC: 4.7 10*3/uL (ref 4.0–10.5)

## 2021-06-21 LAB — COMPREHENSIVE METABOLIC PANEL
ALT: 11 U/L (ref 0–35)
AST: 16 U/L (ref 0–37)
Albumin: 4.4 g/dL (ref 3.5–5.2)
Alkaline Phosphatase: 84 U/L (ref 39–117)
BUN: 15 mg/dL (ref 6–23)
CO2: 31 mEq/L (ref 19–32)
Calcium: 9.9 mg/dL (ref 8.4–10.5)
Chloride: 102 mEq/L (ref 96–112)
Creatinine, Ser: 0.66 mg/dL (ref 0.40–1.20)
GFR: 97.15 mL/min (ref >60.00)
Glucose, Bld: 78 mg/dL (ref 70–99)
Potassium: 3.7 mEq/L (ref 3.5–5.1)
Sodium: 140 mEq/L (ref 135–145)
Total Bilirubin: 0.5 mg/dL (ref 0.2–1.2)
Total Protein: 7.6 g/dL (ref 6.0–8.3)

## 2021-06-21 NOTE — Assessment & Plan Note (Signed)
Healthy Discussed fitness Pap per gyn (every 3-5 years) Yearly mammogram (FH and estrogen use) Colon due again 2029  COVID booster in the fall Has started flu vaccines Will consider shingrix

## 2021-06-21 NOTE — Assessment & Plan Note (Signed)
BP Readings from Last 3 Encounters:  06/21/21 126/80  05/09/21 116/71  05/19/20 (!) 154/81   Doing well on the HCTZ/triamterene

## 2021-06-21 NOTE — Progress Notes (Signed)
Subjective:    Patient ID: Courtney Sheppard, female    DOB: 12-21-1960, 60 y.o.   MRN: [id number]  HPI Here for physical This visit occurred during the SARS-CoV-2 public health emergency.  Safety protocols were in place, including screening questions prior to the visit, additional usage of staff PPE, and extensive cleaning of exam room while observing appropriate contact time as indicated for disinfecting solutions.   Work has been more normal--but inflation has affected her now Now doing makeup also---Courtney Sheppard (just started)  Did have right knee arthroscopy Now seeing chiropractor---due to limitations due to scar tissue (does scraping)  Skipped gyn due to [place] Not due for pap yet  Current Outpatient Medications on File Prior to Visit  Medication Sig Dispense Refill   cetirizine (ZYRTEC) 10 MG tablet Take 10 mg by mouth daily.     triamterene-hydrochlorothiazide (MAXZIDE-25) 37.5-25 MG tablet TAKE 1 TABLET BY MOUTH EVERY DAY 90 tablet 3   No current facility-administered medications on file prior to visit.    Allergies  Allergen Reactions   Aspirin Nausea Only    Past Medical History:  Diagnosis Date   Allergic rhinitis due to pollen    Arthritis    Hypertension     Past Surgical History:  Procedure Laterality Date   BREAST BIOPSY  2014   negative   COLONOSCOPY  12/06/2015   Ardis Hughs   FOOT SURGERY     herniated disk     KNEE ARTHROSCOPY WITH LATERAL MENISECTOMY Right 05/19/2020   Procedure: Right knee arthroscopy, partial medial and lateral meniscectomies and debridement;  Surgeon: Susa Day, MD;  Location: [facility];  Service: Orthopedics;  Laterality: Right;  60 mins   POLYPECTOMY     UTERINE FIBROID SURGERY      Family History  Problem Relation Age of Onset   Hypertension Mother    Diabetes Mother    Stroke Mother        3 strokes   Breast cancer Mother    Heart failure Father    Heart disease Father    Alcohol abuse Brother    Alcohol abuse Brother     Breast cancer Cousin    Colon cancer Neg Hx    Esophageal cancer Neg Hx    Rectal cancer Neg Hx    Stomach cancer Neg Hx    Colon polyps Neg Hx     Social History   Socioeconomic History   Marital status: Married    Spouse name: Not on file   Number of children: Not on file   Years of education: Not on file   Highest education level: Not on file  Occupational History   Occupation: Aesthetician    Comment: self employed  Tobacco Use   Smoking status: Never   Smokeless tobacco: Never  Vaping Use   Vaping Use: Never used  Substance and Sexual Activity   Alcohol use: Yes    Alcohol/week: 1.0 standard drink    Types: 1 Glasses of wine per week   Drug use: No   Sexual activity: Yes    Birth control/protection: None  Other Topics Concern   Not on file  Social History Narrative   Courtney Sheppard" grew up in [place]. She is married and has a step daughter. She works as an Engineering geologist.       Hobbies: Golfing, Tennis   Social Determinants of Health   Financial Resource Strain: Not on file  Food Insecurity: Not on file  Transportation Needs: Not on  file  Physical Activity: Not on file  Stress: Not on file  Social Connections: Not on file  Intimate Partner Violence: Not on file   Review of Systems  Constitutional:        Just started with fitness/core exercise. Some limitations due to knee Wears seat belt Gained 10# in past year Careful with eating  HENT:  Negative for dental problem, hearing loss, tinnitus and trouble swallowing.        Nuts get caught in her throat ("flap" that she can see--behind uvula) Keeps up with dentist  Eyes:  Negative for visual disturbance.       No diplopia or unilateral vision loss  Respiratory:  Negative for cough, chest tightness and shortness of breath.   Cardiovascular:  Negative for chest pain and palpitations.       Slight ankle puffiness if she has a lot of salt  Gastrointestinal:  Negative for blood in stool and constipation.        Rare heartburn--tums/rolaids works  Endocrine: Negative for polydipsia and polyuria.  Genitourinary:  Positive for dyspareunia. Negative for difficulty urinating.       Using topical estrogen now  Musculoskeletal:  Positive for arthralgias. Negative for joint swelling.       Occ low back pain  Skin:  Negative for rash.       No suspicious spots  Allergic/Immunologic: Positive for environmental allergies. Negative for immunocompromised state.       Cetirizine works well  Neurological:  Negative for dizziness, syncope, light-headedness and headaches.  Hematological:  Negative for adenopathy. Does not bruise/bleed easily.  Psychiatric/Behavioral:  Negative for sleep disturbance. The patient is not nervous/anxious.        Grief counseling since mom's death      Objective:   Physical Exam Constitutional:      Appearance: Normal appearance.  HENT:     Right Ear: Tympanic membrane and ear canal normal.     Left Ear: Tympanic membrane and ear canal normal.     Mouth/Throat:     Pharynx: No oropharyngeal exudate or posterior oropharyngeal erythema.  Eyes:     Conjunctiva/sclera: Conjunctivae normal.     Pupils: Pupils are equal, round, and reactive to light.  Cardiovascular:     Rate and Rhythm: Normal rate and regular rhythm.     Pulses: Normal pulses.     Heart sounds: No murmur heard.   No gallop.  Pulmonary:     Effort: Pulmonary effort is normal.     Breath sounds: Normal breath sounds. No wheezing or rales.  Abdominal:     Palpations: Abdomen is soft.     Tenderness: There is no abdominal tenderness.  Musculoskeletal:     Cervical back: Neck supple.     Right lower leg: No edema.     Left lower leg: No edema.  Lymphadenopathy:     Cervical: No cervical adenopathy.  Skin:    General: Skin is warm.     Findings: No rash.  Neurological:     General: No focal deficit present.     Mental Status: She is alert and oriented to person, place, and time.  Psychiatric:         Mood and Affect: Mood normal.        Behavior: Behavior normal.           Assessment & Plan:

## 2021-08-11 ENCOUNTER — Other Ambulatory Visit: Payer: Self-pay | Admitting: Internal Medicine

## 2022-02-27 ENCOUNTER — Ambulatory Visit (INDEPENDENT_AMBULATORY_CARE_PROVIDER_SITE_OTHER): Payer: 59 | Admitting: Podiatry

## 2022-02-27 ENCOUNTER — Other Ambulatory Visit: Payer: Self-pay

## 2022-02-27 ENCOUNTER — Encounter: Payer: Self-pay | Admitting: Podiatry

## 2022-02-27 ENCOUNTER — Ambulatory Visit (INDEPENDENT_AMBULATORY_CARE_PROVIDER_SITE_OTHER): Payer: 59

## 2022-02-27 DIAGNOSIS — M2042 Other hammer toe(s) (acquired), left foot: Secondary | ICD-10-CM

## 2022-02-27 DIAGNOSIS — D2371 Other benign neoplasm of skin of right lower limb, including hip: Secondary | ICD-10-CM

## 2022-02-27 DIAGNOSIS — L603 Nail dystrophy: Secondary | ICD-10-CM

## 2022-02-27 DIAGNOSIS — D2372 Other benign neoplasm of skin of left lower limb, including hip: Secondary | ICD-10-CM

## 2022-02-27 DIAGNOSIS — M2041 Other hammer toe(s) (acquired), right foot: Secondary | ICD-10-CM

## 2022-02-27 NOTE — Progress Notes (Signed)
?  Subjective:  ?Patient ID: Courtney Sheppard, female    DOB: 04/11/1961,  MRN: [id number] ?HPI ?Chief Complaint  ?Patient presents with  ? Toe Pain  ?  5th toe bilateral - previous surgery - developing corns agains  ? Nail Problem  ?  Hallux bilateral - concerned about nail fungus - took lamisil in 2015  ? New Patient (Initial Visit)  ?  Est pt 2015  ? ? ?61 y.o. female presents with the above complaint.  ? ?ROS: Denies fever chills nausea vomiting muscle aches pains calf pain back pain chest pain shortness of breath. ? ?Past Medical History:  ?Diagnosis Date  ? Allergic rhinitis due to pollen   ? Arthritis   ? Hypertension   ? ?Past Surgical History:  ?Procedure Laterality Date  ? BREAST BIOPSY  2014  ? negative  ? COLONOSCOPY  12/06/2015  ? Ardis Hughs  ? FOOT SURGERY    ? herniated disk    ? KNEE ARTHROSCOPY WITH LATERAL MENISECTOMY Right 05/19/2020  ? Procedure: Right knee arthroscopy, partial medial and lateral meniscectomies and debridement;  Surgeon: Susa Day, MD;  Location: [facility];  Service: Orthopedics;  Laterality: Right;  60 mins  ? POLYPECTOMY    ? UTERINE FIBROID SURGERY    ? ? ?Current Outpatient Medications:  ?  cetirizine (ZYRTEC) 10 MG tablet, Take 10 mg by mouth daily., Disp: , Rfl:  ?  triamterene-hydrochlorothiazide (MAXZIDE-25) 37.5-25 MG tablet, TAKE 1 TABLET BY MOUTH EVERY DAY, Disp: 90 tablet, Rfl: 3 ? ?Allergies  ?Allergen Reactions  ? Aspirin Nausea Only  ? ?Review of Systems ?Objective:  ?There were no vitals filed for this visit. ? ?General: Well developed, nourished, in no acute distress, alert and oriented x3  ? ?Dermatological: Skin is warm, dry and supple bilateral. Nails x 10 are well maintained; remaining integument appears unremarkable at this time. There are no open sores, no preulcerative lesions, no rash or signs of infection present.  She has thickening of the toenails hallux bilateral ? ?Vascular: Dorsalis Pedis artery and Posterior Tibial artery pedal pulses are 2/4 bilateral  with immedate capillary fill time. Pedal hair growth present. No varicosities and no lower extremity edema present bilateral.  ? ?Neruologic: Grossly intact via light touch bilateral. Vibratory intact via tuning fork bilateral. Protective threshold with Semmes Wienstein monofilament intact to all pedal sites bilateral. Patellar and Achilles deep tendon reflexes 2+ bilateral. No Babinski or clonus noted bilateral.  ? ?Musculoskeletal: No gross boney pedal deformities bilateral. No pain, crepitus, or limitation noted with foot and ankle range of motion bilateral. Muscular strength 5/5 in all groups tested bilateral.  No reproducible pain on palpation of the fifth digits bilateral ? ?Gait: Unassisted, Nonantalgic.  ? ? ?Radiographs: ? 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  She retains a Synthes screw to the fifth metatarsal of the left foot. ? ?Assessment & Plan:  ? 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 ?Plan: Discussed etiology pathology conservative surgical therapies took samples of the skin and nail to be sent for pathologic evaluation.  Also discussed surgical correction of the toes.  She understands and is amenable to it. ? ? ? ? ?Ryder Man T. Brilliant, DPM ?

## 2022-04-05 ENCOUNTER — Encounter: Payer: 59 | Admitting: Podiatry

## 2022-05-10 ENCOUNTER — Encounter: Payer: 59 | Admitting: Podiatry

## 2022-06-23 ENCOUNTER — Encounter: Payer: 59 | Admitting: Internal Medicine

## 2022-06-28 ENCOUNTER — Encounter: Payer: Self-pay | Admitting: Internal Medicine

## 2022-06-28 ENCOUNTER — Ambulatory Visit (INDEPENDENT_AMBULATORY_CARE_PROVIDER_SITE_OTHER): Payer: 59 | Admitting: Internal Medicine

## 2022-06-28 VITALS — BP 130/82 | HR 77 | Temp 97.5°F | Ht 67.0 in | Wt 233.4 lb

## 2022-06-28 DIAGNOSIS — Z0000 Encounter for general adult medical examination without abnormal findings: Secondary | ICD-10-CM

## 2022-06-28 DIAGNOSIS — I10 Essential (primary) hypertension: Secondary | ICD-10-CM | POA: Diagnosis not present

## 2022-06-28 LAB — COMPREHENSIVE METABOLIC PANEL
ALT: 13 U/L (ref 0–35)
AST: 16 U/L (ref 0–37)
Albumin: 4.5 g/dL (ref 3.5–5.2)
Alkaline Phosphatase: 77 U/L (ref 39–117)
BUN: 21 mg/dL (ref 6–23)
CO2: 32 mEq/L (ref 19–32)
Calcium: 10 mg/dL (ref 8.4–10.5)
Chloride: 102 mEq/L (ref 96–112)
Creatinine, Ser: 0.72 mg/dL (ref 0.40–1.20)
GFR: 91.94 mL/min (ref >60.00)
Glucose, Bld: 98 mg/dL (ref 70–99)
Potassium: 3.7 mEq/L (ref 3.5–5.1)
Sodium: 140 mEq/L (ref 135–145)
Total Bilirubin: 0.5 mg/dL (ref 0.2–1.2)
Total Protein: 7.4 g/dL (ref 6.0–8.3)

## 2022-06-28 LAB — CBC
HCT: 38.2 % (ref 36.0–46.0)
Hemoglobin: 12.9 g/dL (ref 12.0–15.0)
MCHC: 33.6 g/dL (ref 30.0–36.0)
MCV: 91.8 fl (ref 78.0–100.0)
Platelets: 194 10*3/uL (ref 150.0–400.0)
RBC: 4.16 Mil/uL (ref 3.87–5.11)
RDW: 13.9 % (ref 11.5–15.5)
WBC: 4.1 10*3/uL (ref 4.0–10.5)

## 2022-06-28 NOTE — Assessment & Plan Note (Signed)
BP Readings from Last 3 Encounters:  06/28/22 130/82  06/21/21 126/80  05/09/21 116/71   Good control on triamterene HCTZ

## 2022-06-28 NOTE — Progress Notes (Signed)
Subjective:    Patient ID: Courtney Sheppard, female    DOB: 1961-06-04, 61 y.o.   MRN: [id number]  HPI Here for physical  Work still slow Doing some [name]  Knee is getting better Joined line dance class and back to gym Trying to be careful   New gyn--here in [place] there next week  Current Outpatient Medications on File Prior to Visit  Medication Sig Dispense Refill   cetirizine (ZYRTEC) 10 MG tablet Take 10 mg by mouth daily.     triamterene-hydrochlorothiazide (MAXZIDE-25) 37.5-25 MG tablet TAKE 1 TABLET BY MOUTH EVERY DAY 90 tablet 3   No current facility-administered medications on file prior to visit.    Allergies  Allergen Reactions   Aspirin Nausea Only    Past Medical History:  Diagnosis Date   Allergic rhinitis due to pollen    Arthritis    Hypertension     Past Surgical History:  Procedure Laterality Date   BREAST BIOPSY  2014   negative   COLONOSCOPY  12/06/2015   Ardis Hughs   FOOT SURGERY     herniated disk     KNEE ARTHROSCOPY WITH LATERAL MENISECTOMY Right 05/19/2020   Procedure: Right knee arthroscopy, partial medial and lateral meniscectomies and debridement;  Surgeon: Susa Day, MD;  Location: [facility];  Service: Orthopedics;  Laterality: Right;  60 mins   POLYPECTOMY     UTERINE FIBROID SURGERY      Family History  Problem Relation Age of Onset   Hypertension Mother    Diabetes Mother    Stroke Mother        3 strokes   Breast cancer Mother    Heart failure Father    Heart disease Father    Alcohol abuse Brother    Alcohol abuse Brother    Breast cancer Cousin    Colon cancer Neg Hx    Esophageal cancer Neg Hx    Rectal cancer Neg Hx    Stomach cancer Neg Hx    Colon polyps Neg Hx     Social History   Socioeconomic History   Marital status: Married    Spouse name: Not on file   Number of children: Not on file   Years of education: Not on file   Highest education level: Not on file  Occupational History    Occupation: Aesthetician    Comment: self employed  Tobacco Use   Smoking status: Never   Smokeless tobacco: Never  Vaping Use   Vaping Use: Never used  Substance and Sexual Activity   Alcohol use: Yes    Alcohol/week: 1.0 standard drink of alcohol    Types: 1 Glasses of wine per week   Drug use: No   Sexual activity: Yes    Birth control/protection: None  Other Topics Concern   Not on file  Social History Narrative   Courtney Sheppard" grew up in [place]. She is married and has a step daughter. She works as an Engineering geologist.       Hobbies: Golfing, Tennis   Social Determinants of Radio broadcast assistant Strain: Not on file  Food Insecurity: Not on file  Transportation Needs: Not on file  Physical Activity: Not on file  Stress: Not on file  Social Connections: Not on file  Intimate Partner Violence: Not on file   Review of Systems  Constitutional:  Negative for fatigue and unexpected weight change.       Wears seat belt  HENT:  Negative for dental problem, hearing loss and tinnitus.        Some dysphagia with almonds Overdue with dentist  Eyes:  Negative for visual disturbance.       No diplopia or unilateral vision loss  Respiratory:  Negative for cough, chest tightness and shortness of breath.   Cardiovascular:  Negative for chest pain, palpitations and leg swelling.  Gastrointestinal:  Negative for blood in stool and constipation.       Intermittent heartburn---uses tums every 2-3 months  Endocrine: Negative for polydipsia and polyuria.  Genitourinary:  Negative for dyspareunia, dysuria and hematuria.  Musculoskeletal:  Negative for arthralgias, back pain and joint swelling.  Skin:  Negative for rash.  Allergic/Immunologic: Negative for environmental allergies and immunocompromised state.  Neurological:  Negative for dizziness, syncope, light-headedness and headaches.  Hematological:  Negative for adenopathy. Does not bruise/bleed easily.   Psychiatric/Behavioral:  Negative for sleep disturbance. The patient is not nervous/anxious.        Still occasional grief--nothing persistent       Objective:   Physical Exam Constitutional:      Appearance: Normal appearance.  HENT:     Mouth/Throat:     Pharynx: No oropharyngeal exudate or posterior oropharyngeal erythema.  Eyes:     Conjunctiva/sclera: Conjunctivae normal.     Pupils: Pupils are equal, round, and reactive to light.  Cardiovascular:     Rate and Rhythm: Normal rate and regular rhythm.     Pulses: Normal pulses.     Heart sounds: No murmur heard.    No gallop.  Pulmonary:     Effort: Pulmonary effort is normal.     Breath sounds: Normal breath sounds. No wheezing or rales.  Abdominal:     Palpations: Abdomen is soft.     Tenderness: There is no abdominal tenderness.  Musculoskeletal:     Cervical back: Neck supple.     Right lower leg: No edema.     Left lower leg: No edema.  Lymphadenopathy:     Cervical: No cervical adenopathy.  Skin:    Findings: No lesion or rash.  Neurological:     General: No focal deficit present.     Mental Status: She is alert and oriented to person, place, and time.  Psychiatric:        Mood and Affect: Mood normal.        Behavior: Behavior normal.            Assessment & Plan:

## 2022-06-28 NOTE — Assessment & Plan Note (Signed)
Healthy Discussed exercise and healthy eating Just had mammogram Colon due 2029 Seeing gyn soon Prefers no shingrix Updated COVID and flu vaccines in the fall

## 2022-08-23 ENCOUNTER — Encounter: Payer: 59 | Admitting: Family Medicine

## 2022-10-22 ENCOUNTER — Other Ambulatory Visit: Payer: Self-pay | Admitting: Internal Medicine

## 2022-11-13 ENCOUNTER — Telehealth: Payer: Self-pay

## 2022-11-13 NOTE — Telephone Encounter (Signed)
Called pt to confirm appointment for tomorrow.   Left message for pt to call and confirm appointment by 1500 11/28.

## 2022-11-14 ENCOUNTER — Telehealth: Payer: Self-pay

## 2022-11-14 NOTE — Telephone Encounter (Signed)
    Called pt to confirm appointment for tomorrow.   Left message for pt to call and confirm appointment by 1500 11/28.

## 2022-11-15 ENCOUNTER — Other Ambulatory Visit (HOSPITAL_COMMUNITY)
Admission: RE | Admit: 2022-11-15 | Discharge: 2022-11-15 | Disposition: A | Discharge status: Home / Self Care | Payer: 59 | Source: Ambulatory Visit | Attending: Family Medicine | Admitting: Family Medicine

## 2022-11-15 ENCOUNTER — Encounter: Payer: Self-pay | Admitting: Family Medicine

## 2022-11-15 ENCOUNTER — Ambulatory Visit: Payer: 59 | Admitting: Family Medicine

## 2022-11-15 VITALS — BP 145/81 | HR 76 | Ht 67.0 in | Wt 227.0 lb

## 2022-11-15 DIAGNOSIS — Z124 Encounter for screening for malignant neoplasm of cervix: Secondary | ICD-10-CM | POA: Diagnosis not present

## 2022-11-15 DIAGNOSIS — N898 Other specified noninflammatory disorders of vagina: Secondary | ICD-10-CM | POA: Diagnosis not present

## 2022-11-15 DIAGNOSIS — Z01419 Encounter for gynecological examination (general) (routine) without abnormal findings: Secondary | ICD-10-CM

## 2022-11-15 MED ORDER — ESTRADIOL 0.1 MG/GM VA CREA: 1.0000 | TOPICAL_CREAM | VAGINAL | 2 refills | Status: DC

## 2022-11-15 NOTE — Progress Notes (Signed)
Annual  Mammogram: 05/08/22 at [place] Hx of Breast Cancer: Mom Dx at 59 yrs old. Last Pap: 2 yrs ago per pt.  Pt need refill on Estradial was seeing Dr.Cousins / [facility]   Pt has concerns about weight management.

## 2022-11-15 NOTE — Progress Notes (Signed)
Subjective:     Courtney Sheppard is a 61 y.o. female and is here for a comprehensive physical exam. The patient reports problems - vaginal dryness . LMP 5 years ago. Still having hot flashes. Has vaginal dryness on topical estrogen. Is a G0P0000, s/p myomectomy in the past.  The following portions of the patient's history were reviewed and updated as appropriate: allergies, current medications, past family history, past medical history, past social history, past surgical history, and problem list.  Review of Systems Pertinent items noted in HPI and remainder of comprehensive ROS otherwise negative.   Objective:    BP (!) 145/81   Pulse 76   Ht '5\' 7"'$  (1.702 m)   Wt 227 lb (103 kg)   LMP 10/18/2014   BMI 35.55 kg/m  General appearance: alert, cooperative, and appears stated age Head: Normocephalic, without obvious abnormality, atraumatic Neck: no adenopathy, supple, symmetrical, trachea midline, and thyroid not enlarged, symmetric, no tenderness/mass/nodules Lungs: clear to auscultation bilaterally Breasts: normal appearance, no masses or tenderness Heart: regular rate and rhythm, S1, S2 normal, no murmur, click, rub or gallop Abdomen: soft, non-tender; bowel sounds normal; no masses,  no organomegaly Pelvic: cervix normal in appearance, external genitalia normal, no adnexal masses or tenderness, no cervical motion tenderness, uterus normal size, shape, and consistency, and vaginal atrophy Extremities: extremities normal, atraumatic, no cyanosis or edema Pulses: 2+ and symmetric Skin: Skin color, texture, turgor normal. No rashes or lesions Lymph nodes: Cervical, supraclavicular, and axillary nodes normal. Neurologic: Grossly normal    Assessment:    GYN female exam.      Plan:     See After Visit Summary for Counseling Recommendations

## 2022-11-17 LAB — CYTOLOGY - PAP
Comment: NEGATIVE
Diagnosis: HIGH — AB
High risk HPV: NEGATIVE

## 2022-11-20 ENCOUNTER — Telehealth: Payer: Self-pay

## 2022-11-20 NOTE — Telephone Encounter (Signed)
Left message for pt to call office back regarding colpo procedure / CMA inbasket

## 2022-12-27 ENCOUNTER — Ambulatory Visit (INDEPENDENT_AMBULATORY_CARE_PROVIDER_SITE_OTHER): Payer: 59 | Admitting: Family Medicine

## 2022-12-27 ENCOUNTER — Other Ambulatory Visit (HOSPITAL_COMMUNITY)
Admission: RE | Admit: 2022-12-27 | Discharge: 2022-12-27 | Disposition: A | Discharge status: Home / Self Care | Payer: 59 | Source: Ambulatory Visit | Attending: Family Medicine | Admitting: Family Medicine

## 2022-12-27 ENCOUNTER — Encounter: Payer: Self-pay | Admitting: Family Medicine

## 2022-12-27 VITALS — BP 150/84 | HR 72

## 2022-12-27 DIAGNOSIS — R87611 Atypical squamous cells cannot exclude high grade squamous intraepithelial lesion on cytologic smear of cervix (ASC-H): Secondary | ICD-10-CM | POA: Diagnosis not present

## 2022-12-27 DIAGNOSIS — N870 Mild cervical dysplasia: Secondary | ICD-10-CM | POA: Diagnosis not present

## 2022-12-27 NOTE — Progress Notes (Signed)
    GYNECOLOGY OFFICE COLPOSCOPY PROCEDURE NOTE  62 y.o. G0P0000 here for colposcopy for ASC cannot exclude high grade lesion [facility]) pap smear on 11/15/2022. Discussed role for HPV in cervical dysplasia, need for surveillance.  Patient gave informed written consent, time out was performed.  Placed in lithotomy position. Cervix viewed with speculum and colposcope after application of acetic acid.   Colposcopy adequate? Yes  acetowhite lesion(s) noted at SCJ; 4 quadrant biopsies obtained.  ECC specimen obtained. All specimens were labeled and sent to pathology.  Chaperone was present during entire procedure.  Patient was given post procedure instructions.  Will follow up pathology and manage accordingly; patient will be contacted with results and recommendations.  Routine preventative health maintenance measures emphasized.  Donnamae Jude, MD 12/27/2022 3:03 PM

## 2022-12-29 LAB — SURGICAL PATHOLOGY

## 2023-01-01 ENCOUNTER — Telehealth: Payer: Self-pay

## 2023-01-01 NOTE — Telephone Encounter (Signed)
Pt made aware of results.  Pt advised will repeat pap in 1 yr  Pt agreeable and voiced understanding.

## 2023-01-01 NOTE — Telephone Encounter (Signed)
-----  Message from Donnamae Jude, MD sent at 01/01/2023  8:17 AM EST ----- Recall pap in 1 year.

## 2023-07-03 ENCOUNTER — Encounter: Payer: 59 | Admitting: Internal Medicine

## 2023-07-18 ENCOUNTER — Encounter (INDEPENDENT_AMBULATORY_CARE_PROVIDER_SITE_OTHER): Payer: Self-pay

## 2023-07-24 DIAGNOSIS — Z1231 Encounter for screening mammogram for malignant neoplasm of breast: Secondary | ICD-10-CM | POA: Diagnosis not present

## 2023-07-24 LAB — HM MAMMOGRAPHY

## 2023-07-25 ENCOUNTER — Encounter: Payer: 59 | Admitting: Internal Medicine

## 2023-07-26 ENCOUNTER — Encounter: Payer: Self-pay | Admitting: Internal Medicine

## 2023-08-01 ENCOUNTER — Encounter: Payer: Self-pay | Admitting: Internal Medicine

## 2023-08-01 ENCOUNTER — Ambulatory Visit (INDEPENDENT_AMBULATORY_CARE_PROVIDER_SITE_OTHER): Payer: 59 | Admitting: Internal Medicine

## 2023-08-01 VITALS — BP 122/70 | HR 78 | Temp 97.8°F | Ht 66.5 in | Wt 213.0 lb

## 2023-08-01 DIAGNOSIS — I10 Essential (primary) hypertension: Secondary | ICD-10-CM

## 2023-08-01 DIAGNOSIS — Z0000 Encounter for general adult medical examination without abnormal findings: Secondary | ICD-10-CM | POA: Diagnosis not present

## 2023-08-01 LAB — CBC
HCT: 38.8 % (ref 36.0–46.0)
Hemoglobin: 12.9 g/dL (ref 12.0–15.0)
MCHC: 33.2 g/dL (ref 30.0–36.0)
MCV: 93.3 fl (ref 78.0–100.0)
Platelets: 202 10*3/uL (ref 150.0–400.0)
RBC: 4.16 Mil/uL (ref 3.87–5.11)
RDW: 13.3 % (ref 11.5–15.5)
WBC: 4.1 10*3/uL (ref 4.0–10.5)

## 2023-08-01 LAB — COMPREHENSIVE METABOLIC PANEL
ALT: 10 U/L (ref 0–35)
AST: 15 U/L (ref 0–37)
Albumin: 4.2 g/dL (ref 3.5–5.2)
Alkaline Phosphatase: 78 U/L (ref 39–117)
BUN: 19 mg/dL (ref 6–23)
CO2: 31 mEq/L (ref 19–32)
Calcium: 10 mg/dL (ref 8.4–10.5)
Chloride: 101 mEq/L (ref 96–112)
Creatinine, Ser: 0.7 mg/dL (ref 0.40–1.20)
GFR: 94.37 mL/min (ref >60.00)
Glucose, Bld: 105 mg/dL — ABNORMAL HIGH (ref 70–99)
Potassium: 3.7 mEq/L (ref 3.5–5.1)
Sodium: 139 mEq/L (ref 135–145)
Total Bilirubin: 0.6 mg/dL (ref 0.2–1.2)
Total Protein: 6.8 g/dL (ref 6.0–8.3)

## 2023-08-01 LAB — LIPID PANEL
Cholesterol: 229 mg/dL — ABNORMAL HIGH (ref 0–200)
HDL: 51.7 mg/dL (ref >39.00)
LDL Cholesterol: 154 mg/dL — ABNORMAL HIGH (ref 0–99)
NonHDL: 177.03
Total CHOL/HDL Ratio: 4
Triglycerides: 113 mg/dL (ref 0.0–149.0)
VLDL: 22.6 mg/dL (ref 0.0–40.0)

## 2023-08-01 NOTE — Progress Notes (Signed)
Subjective:    Patient ID: Courtney Sheppard, female    DOB: 25-Jul-1961, 62 y.o.   MRN: [id number]  HPI Here for physical  Lost 20# from last year Cut sugar and carbs Went back to the gym Now able to walk without knee pain! Able to walk her dog again--1 mile or so  Did have abnormal pap--then colposcopy Just low grade  Stress with husband Past kidney transplant--but now with some rectal mass  Current Outpatient Medications on File Prior to Visit  Medication Sig Dispense Refill   cetirizine (ZYRTEC) 10 MG tablet Take 10 mg by mouth daily.     estradiol (ESTRACE) 0.1 MG/GM vaginal cream Place 1 Applicatorful vaginally 2 (two) times a week. 42.5 g 2   triamterene-hydrochlorothiazide (MAXZIDE-25) 37.5-25 MG tablet TAKE 1 TABLET BY MOUTH EVERY DAY 90 tablet 3   No current facility-administered medications on file prior to visit.    Allergies  Allergen Reactions   Aspirin Nausea Only    Past Medical History:  Diagnosis Date   Allergic rhinitis due to pollen    Arthritis    Hypertension     Past Surgical History:  Procedure Laterality Date   BREAST BIOPSY  2014   negative   COLONOSCOPY  12/06/2015   Christella Hartigan   FOOT SURGERY     herniated disk     KNEE ARTHROSCOPY WITH LATERAL MENISECTOMY Right 05/19/2020   Procedure: Right knee arthroscopy, partial medial and lateral meniscectomies and debridement;  Surgeon: Jene Every, MD;  Location: [facility];  Service: Orthopedics;  Laterality: Right;  60 mins   POLYPECTOMY     UTERINE FIBROID SURGERY      Family History  Problem Relation Age of Onset   Breast cancer Mother 76   Hypertension Mother    Diabetes Mother    Stroke Mother        3 strokes   Heart failure Father    Heart disease Father    Alcohol abuse Brother    Alcohol abuse Brother    Breast cancer Cousin    Colon cancer Neg Hx    Esophageal cancer Neg Hx    Rectal cancer Neg Hx    Stomach cancer Neg Hx    Colon polyps Neg Hx     Social History    Socioeconomic History   Marital status: Married    Spouse name: Not on file   Number of children: Not on file   Years of education: Not on file   Highest education level: Not on file  Occupational History   Occupation: Aesthetician    Comment: self employed  Tobacco Use   Smoking status: Never   Smokeless tobacco: Never  Vaping Use   Vaping status: Never Used  Substance and Sexual Activity   Alcohol use: Yes    Alcohol/week: 1.0 standard drink of alcohol    Types: 1 Glasses of wine per week   Drug use: No   Sexual activity: Not Currently    Partners: Male    Birth control/protection: None    Comment: Married  Other Topics Concern   Not on file  Social History Narrative   Courtney "Courtney Sheppard" grew up in [place]. She is married and has a step daughter. She works as an Public librarian.       Hobbies: Golfing, Tennis   Social Determinants of Health   Financial Resource Strain: Not on file  Food Insecurity: Not on file  Transportation Needs: Not on file  Physical Activity: Not  on file  Stress: Not on file  Social Connections: Not on file  Intimate Partner Violence: Not on file   Review of Systems  Constitutional:  Negative for fatigue.       Wears seat belt  HENT:  Negative for dental problem, hearing loss and tinnitus.        Keeps up with dentist  Eyes:  Negative for visual disturbance.       No diplopia or unilateral vision loss  Respiratory:  Negative for cough, chest tightness and shortness of breath.   Cardiovascular:  Negative for chest pain and leg swelling.       Rare palpitations---with emotion  Gastrointestinal:  Negative for blood in stool and constipation.       Rare heartburn--tums heps  Endocrine: Negative for polydipsia and polyuria.  Genitourinary:  Negative for dysuria and hematuria.       No sex-no problem  Musculoskeletal:  Positive for arthralgias. Negative for back pain and joint swelling.       Knee pain not as bad Goes to chiropractor --for  back and knees (bends at work all the time)  Skin:  Negative for rash.  Allergic/Immunologic: Negative for environmental allergies and immunocompromised state.       No problems with daily zyrtec  Neurological:  Negative for dizziness, syncope, light-headedness and headaches.  Hematological:  Negative for adenopathy. Does not bruise/bleed easily.  Psychiatric/Behavioral:  Negative for dysphoric mood and sleep disturbance. The patient is not nervous/anxious.        Objective:   Physical Exam Constitutional:      Appearance: Normal appearance.  HENT:     Mouth/Throat:     Pharynx: No oropharyngeal exudate or posterior oropharyngeal erythema.  Eyes:     Conjunctiva/sclera: Conjunctivae normal.     Pupils: Pupils are equal, round, and reactive to light.  Cardiovascular:     Rate and Rhythm: Normal rate and regular rhythm.     Pulses: Normal pulses.     Heart sounds: No murmur heard.    No gallop.  Pulmonary:     Effort: Pulmonary effort is normal.     Breath sounds: Normal breath sounds. No wheezing or rales.  Abdominal:     Palpations: Abdomen is soft.     Tenderness: There is no abdominal tenderness.  Musculoskeletal:     Cervical back: Neck supple.     Right lower leg: No edema.     Left lower leg: No edema.  Lymphadenopathy:     Cervical: No cervical adenopathy.  Skin:    Findings: No rash.  Neurological:     General: No focal deficit present.     Mental Status: She is alert and oriented to person, place, and time.  Psychiatric:        Mood and Affect: Mood normal.        Behavior: Behavior normal.            Assessment & Plan:

## 2023-08-01 NOTE — Assessment & Plan Note (Signed)
BP Readings from Last 3 Encounters:  08/01/23 122/70  12/27/22 (!) 150/84  11/15/22 (!) 145/81   Controlled with the maxzide 25  Will check labs

## 2023-08-01 NOTE — Assessment & Plan Note (Signed)
Healthy Has done a great job with lifestyle Colon due 2029 Recent mammogram Pap yearly for now Update COVID and flu vaccines this fall Prefers no shingrix

## 2023-10-30 ENCOUNTER — Other Ambulatory Visit: Payer: Self-pay | Admitting: Internal Medicine

## 2024-03-26 ENCOUNTER — Encounter: Payer: Self-pay | Admitting: Family Medicine

## 2024-03-26 ENCOUNTER — Other Ambulatory Visit (HOSPITAL_COMMUNITY)
Admission: RE | Admit: 2024-03-26 | Discharge: 2024-03-26 | Disposition: A | Discharge status: Home / Self Care | Source: Ambulatory Visit | Attending: Family Medicine | Admitting: Family Medicine

## 2024-03-26 ENCOUNTER — Ambulatory Visit (INDEPENDENT_AMBULATORY_CARE_PROVIDER_SITE_OTHER): Payer: 59 | Admitting: Family Medicine

## 2024-03-26 VITALS — BP 147/82 | HR 81 | Wt 222.0 lb

## 2024-03-26 DIAGNOSIS — N952 Postmenopausal atrophic vaginitis: Secondary | ICD-10-CM | POA: Diagnosis not present

## 2024-03-26 DIAGNOSIS — Z124 Encounter for screening for malignant neoplasm of cervix: Secondary | ICD-10-CM | POA: Insufficient documentation

## 2024-03-26 DIAGNOSIS — Z1231 Encounter for screening mammogram for malignant neoplasm of breast: Secondary | ICD-10-CM

## 2024-03-26 DIAGNOSIS — Z01419 Encounter for gynecological examination (general) (routine) without abnormal findings: Secondary | ICD-10-CM

## 2024-03-26 MED ORDER — INTRAROSA 6.5 MG VA INST: 1.0000 | VAGINAL_INSERT | Freq: Every evening | VAGINAL | 1 refills | Status: DC

## 2024-03-26 NOTE — Assessment & Plan Note (Signed)
 99213-trial of Intrarosa. If ok, d/c vaginal estrogen.

## 2024-03-26 NOTE — Progress Notes (Signed)
 Subjective:     Courtney Sheppard is a 63 y.o. female and is here for a comprehensive physical exam. The patient reports no problems. Has estrogen vaginal cream but is hesitant to use it due to FH of breast cancer.  The following portions of the patient's history were reviewed and updated as appropriate: allergies, current medications, past family history, past medical history, past social history, past surgical history, and problem list.  Review of Systems Pertinent items noted in HPI and remainder of comprehensive ROS otherwise negative.   Objective:  Chaperone present for exam   BP (!) 147/82   Pulse 81   Wt 222 lb (100.7 kg)   LMP 10/18/2014   BMI 35.29 kg/m  General appearance: alert, cooperative, and appears stated age Head: Normocephalic, without obvious abnormality, atraumatic Neck: no adenopathy, supple, symmetrical, trachea midline, and thyroid not enlarged, symmetric, no tenderness/mass/nodules Lungs: clear to auscultation bilaterally Breasts: normal appearance, no masses or tenderness Heart: regular rate and rhythm, S1, S2 normal, no murmur, click, rub or gallop Abdomen: soft, non-tender; bowel sounds normal; no masses,  no organomegaly Pelvic: cervix normal in appearance, external genitalia normal, no adnexal masses or tenderness, no cervical motion tenderness, uterus normal size, shape, and consistency, and vagina normal without discharge Extremities: extremities normal, atraumatic, no cyanosis or edema Pulses: 2+ and symmetric Skin: Skin color, texture, turgor normal. No rashes or lesions Lymph nodes: Cervical, supraclavicular, and axillary nodes normal. Neurologic: Grossly normal    Assessment:    Healthy female exam.      Plan:   Problem List Items Addressed This Visit       Unprioritized   Vaginal atrophy   99213-trial of Intrarosa. If ok, d/c vaginal estrogen.      Relevant Medications   Prasterone (INTRAROSA) 6.5 MG INST   Other Visit Diagnoses        Screening for malignant neoplasm of cervix    -  Primary   Relevant Orders   Cytology - PAP     Encounter for gynecological examination without abnormal finding         Encounter for screening mammogram for malignant neoplasm of breast       Relevant Orders   MM 3D SCREENING MAMMOGRAM BILATERAL BREAST      Return in 1 year (on 03/26/2025).    See After Visit Summary for Counseling Recommendations

## 2024-04-03 ENCOUNTER — Encounter: Payer: Self-pay | Admitting: Family Medicine

## 2024-04-03 LAB — CYTOLOGY - PAP
Comment: NEGATIVE
Diagnosis: NEGATIVE
Diagnosis: REACTIVE
High risk HPV: NEGATIVE

## 2024-08-01 ENCOUNTER — Encounter: Payer: 59 | Admitting: Internal Medicine

## 2024-08-01 DIAGNOSIS — Z1231 Encounter for screening mammogram for malignant neoplasm of breast: Secondary | ICD-10-CM | POA: Diagnosis not present

## 2024-08-01 LAB — HM MAMMOGRAPHY

## 2024-08-04 ENCOUNTER — Ambulatory Visit (INDEPENDENT_AMBULATORY_CARE_PROVIDER_SITE_OTHER): Admitting: Internal Medicine

## 2024-08-04 ENCOUNTER — Encounter: Payer: Self-pay | Admitting: Internal Medicine

## 2024-08-04 ENCOUNTER — Ambulatory Visit: Payer: Self-pay | Admitting: Internal Medicine

## 2024-08-04 VITALS — BP 122/80 | HR 76 | Temp 98.0°F | Ht 66.5 in | Wt 228.0 lb

## 2024-08-04 DIAGNOSIS — E785 Hyperlipidemia, unspecified: Secondary | ICD-10-CM | POA: Insufficient documentation

## 2024-08-04 DIAGNOSIS — I10 Essential (primary) hypertension: Secondary | ICD-10-CM

## 2024-08-04 DIAGNOSIS — Z0000 Encounter for general adult medical examination without abnormal findings: Secondary | ICD-10-CM | POA: Diagnosis not present

## 2024-08-04 LAB — COMPREHENSIVE METABOLIC PANEL WITH GFR
ALT: 15 U/L (ref 0–35)
AST: 19 U/L (ref 0–37)
Albumin: 4.1 g/dL (ref 3.5–5.2)
Alkaline Phosphatase: 70 U/L (ref 39–117)
BUN: 17 mg/dL (ref 6–23)
CO2: 32 meq/L (ref 19–32)
Calcium: 9.5 mg/dL (ref 8.4–10.5)
Chloride: 102 meq/L (ref 96–112)
Creatinine, Ser: 0.64 mg/dL (ref 0.40–1.20)
GFR: 95.75 mL/min (ref >60.00)
Glucose, Bld: 106 mg/dL — ABNORMAL HIGH (ref 70–99)
Potassium: 3.9 meq/L (ref 3.5–5.1)
Sodium: 141 meq/L (ref 135–145)
Total Bilirubin: 0.4 mg/dL (ref 0.2–1.2)
Total Protein: 6.9 g/dL (ref 6.0–8.3)

## 2024-08-04 LAB — LIPID PANEL
Cholesterol: 217 mg/dL — ABNORMAL HIGH (ref 0–200)
HDL: 53.9 mg/dL (ref >39.00)
LDL Cholesterol: 144 mg/dL — ABNORMAL HIGH (ref 0–99)
NonHDL: 163.43
Total CHOL/HDL Ratio: 4
Triglycerides: 97 mg/dL (ref 0.0–149.0)
VLDL: 19.4 mg/dL (ref 0.0–40.0)

## 2024-08-04 LAB — CBC
HCT: 37.2 % (ref 36.0–46.0)
Hemoglobin: 12.5 g/dL (ref 12.0–15.0)
MCHC: 33.7 g/dL (ref 30.0–36.0)
MCV: 92 fl (ref 78.0–100.0)
Platelets: 187 K/uL (ref 150.0–400.0)
RBC: 4.04 Mil/uL (ref 3.87–5.11)
RDW: 13.8 % (ref 11.5–15.5)
WBC: 4.2 K/uL (ref 4.0–10.5)

## 2024-08-04 NOTE — Assessment & Plan Note (Signed)
 Healthy  Needs to concentrate on fitness again Flu vaccine in the fall Recent mammogram (yearly) and pap Colon due 2024 Prefers no COVID vaccine and still not sure about shingrix

## 2024-08-04 NOTE — Assessment & Plan Note (Signed)
 BP Readings from Last 3 Encounters:  08/04/24 122/80  03/26/24 (!) 147/82  08/01/23 122/70   Controlled with the maxzide 25 Will check labs

## 2024-08-04 NOTE — Assessment & Plan Note (Signed)
 Discussed statin--she prefers to hold off

## 2024-08-04 NOTE — Progress Notes (Signed)
 Subjective:    Patient ID: Courtney Sheppard, female    DOB: [birth year], 63 y.o.   MRN: [id number]  HPI Here for physical  Doing okay Has noted some pain along both flanks Seems to improve with stretching Noisy sleeper--but no apnea per husband Limited exercise Has regained some of her weight  Current Outpatient Medications on File Prior to Visit  Medication Sig Dispense Refill   cetirizine  (ZYRTEC ) 10 MG tablet Take 10 mg by mouth daily.     triamterene -hydrochlorothiazide (MAXZIDE-25) 37.5-25 MG tablet TAKE 1 TABLET BY MOUTH EVERY DAY 30 tablet 11   No current facility-administered medications on file prior to visit.    Allergies  Allergen Reactions   Aspirin Nausea Only    Past Medical History:  Diagnosis Date   Allergic rhinitis due to pollen    Arthritis    Hypertension     Past Surgical History:  Procedure Laterality Date   BREAST BIOPSY  2014   negative   COLONOSCOPY  12/06/2015   Teressa   FOOT SURGERY     herniated disk     KNEE ARTHROSCOPY WITH LATERAL MENISECTOMY Right 05/19/2020   Procedure: Right knee arthroscopy, partial medial and lateral meniscectomies and debridement;  Surgeon: Duwayne Purchase, MD;  Location: [facility];  Service: Orthopedics;  Laterality: Right;  60 mins   POLYPECTOMY     UTERINE FIBROID SURGERY      Family History  Problem Relation Age of Onset   Breast cancer Mother 90   Hypertension Mother    Diabetes Mother    Stroke Mother        3 strokes   Heart failure Father    Heart disease Father    Alcohol abuse Brother    Alcohol abuse Brother    Breast cancer Cousin    Colon cancer Neg Hx    Esophageal cancer Neg Hx    Rectal cancer Neg Hx    Stomach cancer Neg Hx    Colon polyps Neg Hx     Social History   Socioeconomic History   Marital status: Married    Spouse name: Not on file   Number of children: Not on file   Years of education: Not on file   Highest education level: Not on file  Occupational History    Occupation: Aesthetician    Comment: self employed  Tobacco Use   Smoking status: Never   Smokeless tobacco: Never  Vaping Use   Vaping status: Never Used  Substance and Sexual Activity   Alcohol use: Yes    Alcohol/week: 1.0 standard drink of alcohol    Types: 1 Glasses of wine per week   Drug use: No   Sexual activity: Not Currently    Partners: Male    Birth control/protection: None    Comment: Married  Other Topics Concern   Not on file  Social History Narrative   Courtney Sheppard grew up in [place]. She is married and has a step daughter. She works as an Public librarian.       Hobbies: Golfing, Tennis   Social Drivers of Corporate investment banker Strain: Not on file  Food Insecurity: Not on file  Transportation Needs: Not on file  Physical Activity: Not on file  Stress: Not on file  Social Connections: Not on file  Intimate Partner Violence: Not on file   Review of Systems  Constitutional:  Negative for fatigue.       Wears seat belt  HENT:  Negative for dental problem, hearing loss and tinnitus.        Keeps up with dentist  Eyes:  Negative for visual disturbance.       No diplopia or unilateral vision loss  Respiratory:  Negative for cough, chest tightness and shortness of breath.   Cardiovascular:  Negative for chest pain, palpitations and leg swelling.  Gastrointestinal:  Negative for blood in stool and constipation.       No heartburn  Genitourinary:  Negative for dysuria and hematuria.       No sex -no problem  Musculoskeletal:        Some back and knee pain--rare tylenol   Skin:  Negative for rash.       Lesion on left cheek changed--going to derm  Allergic/Immunologic: Positive for environmental allergies. Negative for immunocompromised state.       Cetirizine  helps  Neurological:  Negative for dizziness, syncope, light-headedness and headaches.  Hematological:  Negative for adenopathy. Does not bruise/bleed easily.  Psychiatric/Behavioral:  Negative  for dysphoric mood and sleep disturbance. The patient is not nervous/anxious.        Objective:   Physical Exam Constitutional:      Appearance: Normal appearance.  HENT:     Mouth/Throat:     Pharynx: No oropharyngeal exudate or posterior oropharyngeal erythema.  Eyes:     Conjunctiva/sclera: Conjunctivae normal.     Pupils: Pupils are equal, round, and reactive to light.  Cardiovascular:     Rate and Rhythm: Normal rate and regular rhythm.     Pulses: Normal pulses.     Heart sounds: No murmur heard.    No gallop.  Pulmonary:     Effort: Pulmonary effort is normal.     Breath sounds: Normal breath sounds. No wheezing or rales.  Abdominal:     Palpations: Abdomen is soft.     Tenderness: There is no abdominal tenderness.  Musculoskeletal:     Cervical back: Neck supple.     Right lower leg: No edema.     Left lower leg: No edema.  Lymphadenopathy:     Cervical: No cervical adenopathy.  Skin:    Findings: No rash.  Neurological:     General: No focal deficit present.     Mental Status: She is alert and oriented to person, place, and time.  Psychiatric:        Mood and Affect: Mood normal.        Behavior: Behavior normal.            Assessment & Plan:

## 2024-08-06 ENCOUNTER — Ambulatory Visit: Payer: Self-pay | Admitting: Internal Medicine

## 2024-08-06 ENCOUNTER — Encounter: Payer: Self-pay | Admitting: Internal Medicine

## 2024-08-07 ENCOUNTER — Telehealth: Payer: Self-pay

## 2024-08-07 DIAGNOSIS — Z1283 Encounter for screening for malignant neoplasm of skin: Secondary | ICD-10-CM

## 2024-08-07 NOTE — Telephone Encounter (Signed)
Left message on vm for pt 

## 2024-08-07 NOTE — Telephone Encounter (Signed)
 Copied from CRM #[id number]. Topic: Referral - Request for Referral >> Aug 07, 2024 10:18 AM [place] A wrote: Did the patient discuss referral with their provider in the last year? Yes (If No - schedule appointment) (If Yes - send message)  Appointment offered? No  Type of order/referral and detailed reason for visit: Dermatology  Preference of office, provider, location: [facility] on [facility]  If referral order, have you been seen by this specialty before? Yes (If Yes, this issue or another issue? When? Where?  Can we respond through MyChart? Yes

## 2024-08-07 NOTE — Addendum Note (Signed)
 Addended by: JIMMY ADE I on: 08/07/2024 12:39 PM   Modules accepted: Orders

## 2024-09-16 ENCOUNTER — Encounter: Payer: Self-pay | Admitting: *Deleted

## 2024-09-17 DIAGNOSIS — L2989 Other pruritus: Secondary | ICD-10-CM | POA: Diagnosis not present

## 2024-09-17 DIAGNOSIS — L538 Other specified erythematous conditions: Secondary | ICD-10-CM | POA: Diagnosis not present

## 2024-09-17 DIAGNOSIS — D220 Melanocytic nevi of lip: Secondary | ICD-10-CM | POA: Diagnosis not present

## 2024-09-17 DIAGNOSIS — L821 Other seborrheic keratosis: Secondary | ICD-10-CM | POA: Diagnosis not present

## 2024-09-17 DIAGNOSIS — L820 Inflamed seborrheic keratosis: Secondary | ICD-10-CM | POA: Diagnosis not present

## 2024-10-17 DIAGNOSIS — L820 Inflamed seborrheic keratosis: Secondary | ICD-10-CM | POA: Diagnosis not present

## 2024-12-02 ENCOUNTER — Ambulatory Visit: Admitting: Podiatry

## 2024-12-15 ENCOUNTER — Other Ambulatory Visit: Payer: Self-pay | Admitting: Internal Medicine

## 2024-12-15 MED ORDER — TRIAMTERENE-HCTZ 37.5-25 MG PO TABS: 1.0000 | ORAL_TABLET | Freq: Every day | ORAL | 2 refills | Status: AC

## 2024-12-15 NOTE — Telephone Encounter (Signed)
 Copied from CRM #[id number]. Topic: Clinical - Medication Refill >> Dec 15, 2024  8:00 AM Emylou G wrote: Medication: triamterene -hydrochlorothiazide (MAXZIDE-25) 37.5-25 MG tablet  Has the patient contacted their pharmacy? Yes (Agent: If no, request that the patient contact the pharmacy for the refill. If patient does not wish to contact the pharmacy document the reason why and proceed with request.) (Agent: If yes, when and what did the pharmacy advise?) said to call us   This is the patient's preferred pharmacy:  [facility] [address], [place] - [address] [place] [address] Phone: [phone] Fax: [phone]  Is this the correct pharmacy for this prescription? Yes If no, delete pharmacy and type the correct one.   Has the prescription been filled recently? No  Is the patient out of the medication? Yes  Has the patient been seen for an appointment in the last year OR does the patient have an upcoming appointment? Yes  Can we respond through MyChart? Yes  Agent: Please be advised that Rx refills may take up to 3 business days. We ask that you follow-up with your pharmacy.

## 2025-01-08 ENCOUNTER — Ambulatory Visit: Payer: Self-pay | Admitting: Podiatry

## 2025-02-17 ENCOUNTER — Encounter
